# Patient Record
Sex: Female | Born: 1975
Health system: Southern US, Community
[De-identification: ages and names within clinical notes are randomized; demographics above are authoritative.]

## PROBLEM LIST (undated history)

## (undated) HISTORY — PX: DENTAL SURGERY: SHX609

---

## 2001-02-09 ENCOUNTER — Encounter: Payer: Self-pay | Admitting: Emergency Medicine

## 2001-02-09 ENCOUNTER — Emergency Department (HOSPITAL_COMMUNITY): Admission: EM | Admit: 2001-02-09 | Discharge: 2001-02-09 | Payer: Self-pay | Admitting: Emergency Medicine

## 2001-02-13 ENCOUNTER — Encounter: Payer: Self-pay | Admitting: Orthopaedic Surgery

## 2001-02-13 ENCOUNTER — Ambulatory Visit (HOSPITAL_COMMUNITY): Admission: RE | Admit: 2001-02-13 | Discharge: 2001-02-13 | Payer: Self-pay | Admitting: Orthopaedic Surgery

## 2001-02-24 ENCOUNTER — Emergency Department (HOSPITAL_COMMUNITY): Admission: EM | Admit: 2001-02-24 | Discharge: 2001-02-25 | Payer: Self-pay | Admitting: *Deleted

## 2006-09-01 ENCOUNTER — Other Ambulatory Visit: Admission: RE | Admit: 2006-09-01 | Discharge: 2006-09-01 | Payer: Self-pay | Admitting: Obstetrics and Gynecology

## 2006-09-30 ENCOUNTER — Ambulatory Visit (HOSPITAL_COMMUNITY): Admission: RE | Admit: 2006-09-30 | Discharge: 2006-09-30 | Payer: Self-pay | Admitting: Obstetrics & Gynecology

## 2006-10-21 ENCOUNTER — Ambulatory Visit (HOSPITAL_COMMUNITY): Admission: RE | Admit: 2006-10-21 | Discharge: 2006-10-21 | Payer: Self-pay | Admitting: Obstetrics and Gynecology

## 2010-05-30 ENCOUNTER — Ambulatory Visit (HOSPITAL_COMMUNITY)
Admission: RE | Admit: 2010-05-30 | Discharge: 2010-05-30 | Payer: Self-pay | Source: Home / Self Care | Attending: Family Medicine | Admitting: Family Medicine

## 2012-01-02 IMAGING — CT CT ABD-PELV W/ CM
2 of 4 series · 16 of 46 positions shown, 18 images · IV contrast (agent unspecified)
Comparison: None.

CLINICAL DATA: Abdominal pain and fever.  Evaluate for small bowel
obstruction.

CT ABDOMEN AND PELVIS WITH CONTRAST
TECHNIQUE: Multidetector CT imaging of the abdomen and pelvis was
performed following the standard protocol during bolus
administration of intravenous contrast.
Contrast: 100 ml [id number]

[Series 2: rtn a/p with · axial · 0.74mm/px · z∈[-462,-27]mm · 13 of 97 slices shown, 15 images]
[im 5/97  soft-tissue]
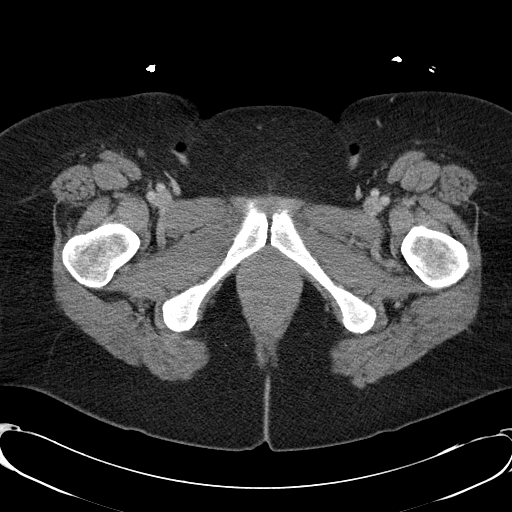
[im 5/97  bone]
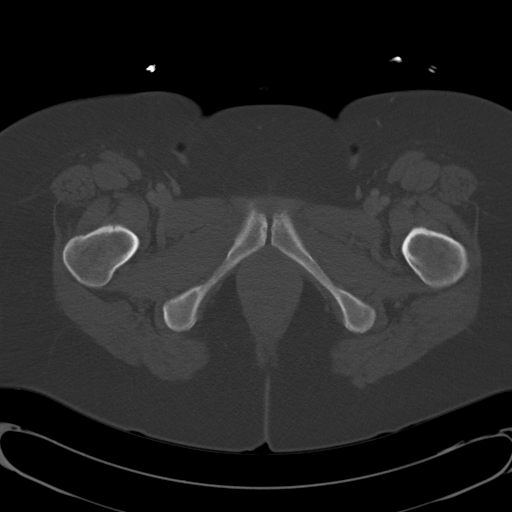
[im 13/97  soft-tissue]
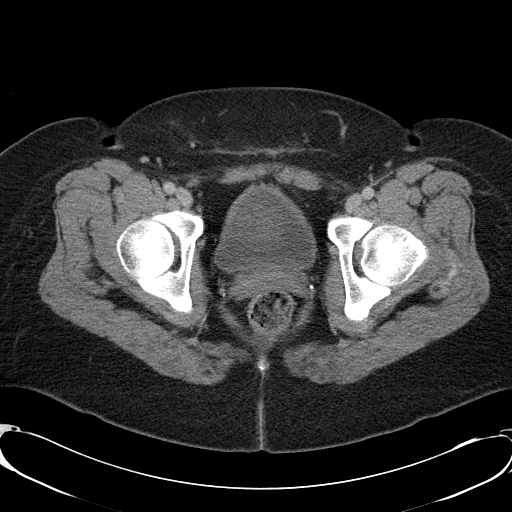
[im 21/97  soft-tissue]
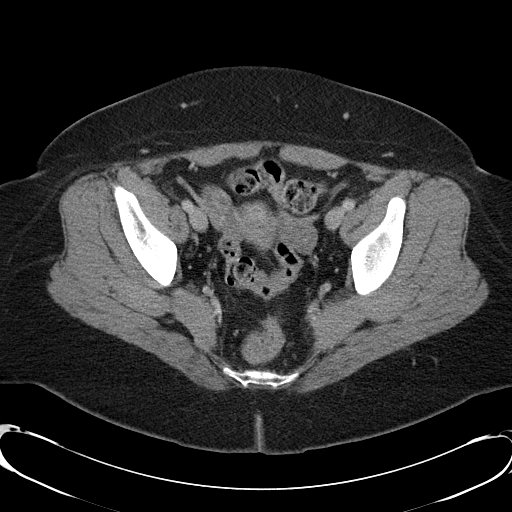
[im 26/97  soft-tissue]
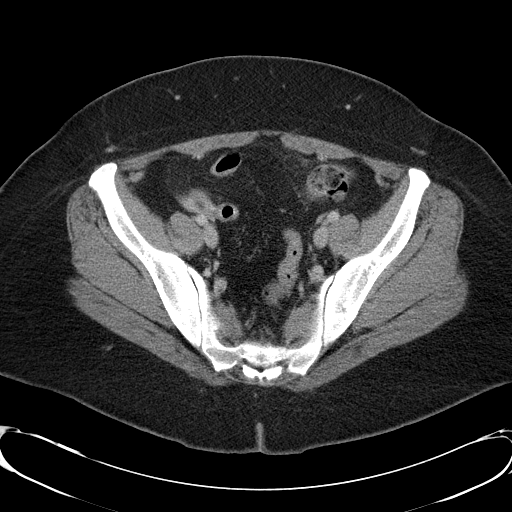
[im 34/97  soft-tissue]
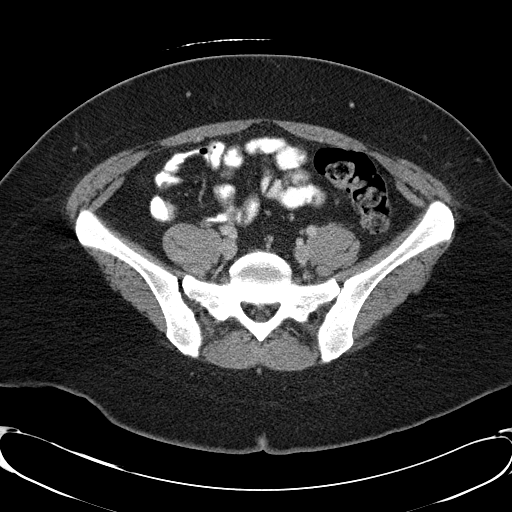
[im 42/97  soft-tissue]
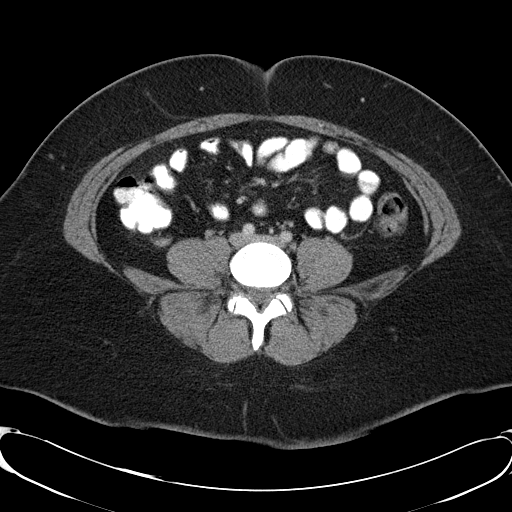
[im 51/97  soft-tissue]
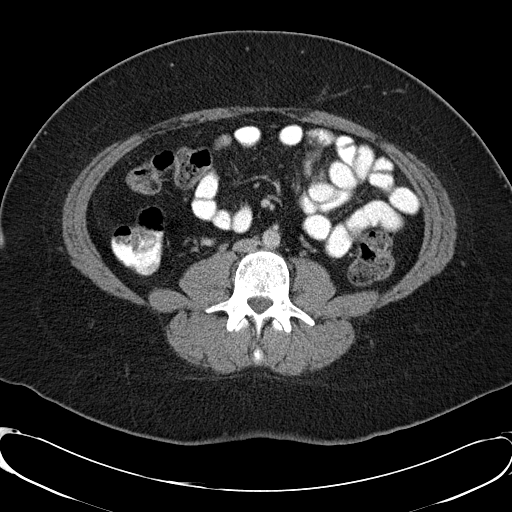
[im 55/97  soft-tissue]
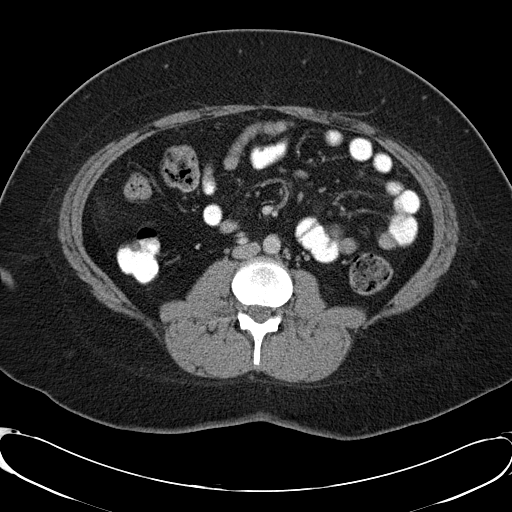
[im 63/97  soft-tissue]
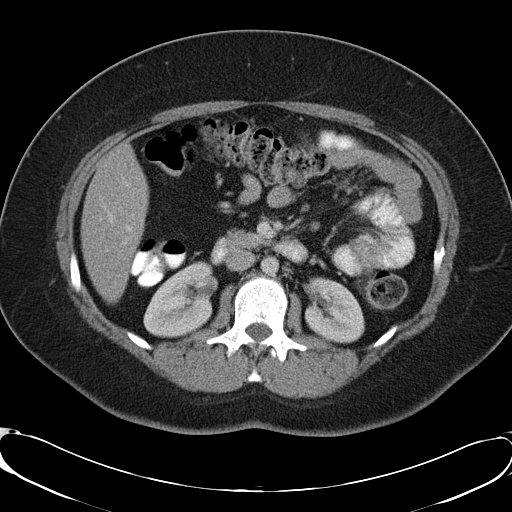
[im 63/97  bone]
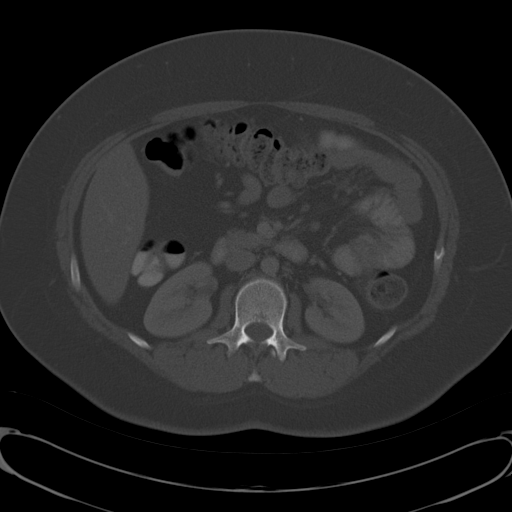
[im 71/97  soft-tissue]
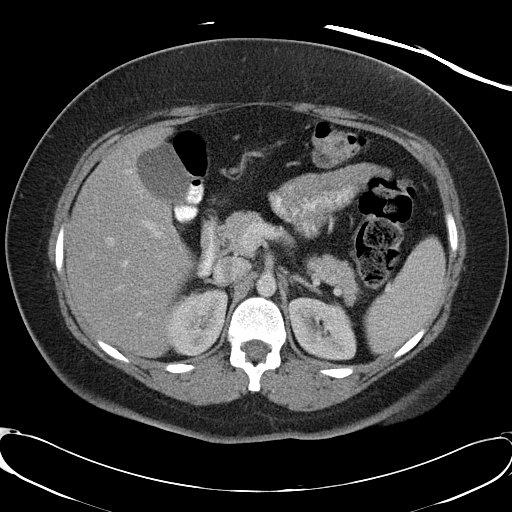
[im 76/97  soft-tissue]
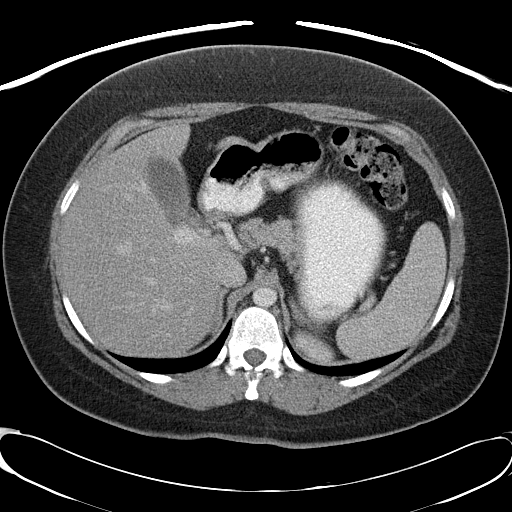
[im 84/97  soft-tissue]
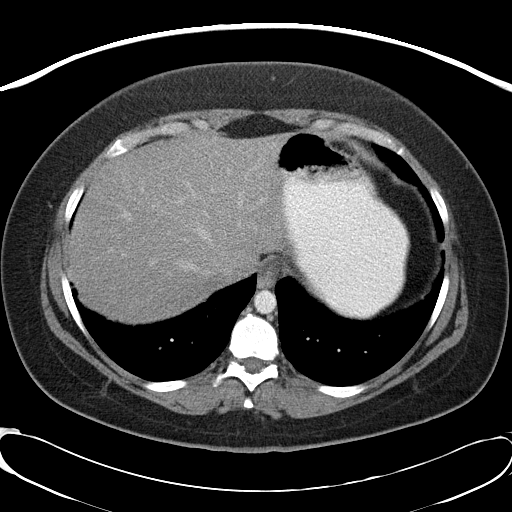
[im 92/97  soft-tissue]
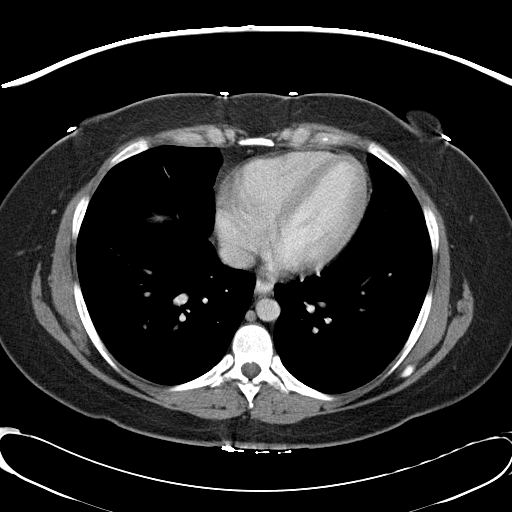

[Series 602: <mpr thick range> · coronal · 0.94mm/px · 3 of 94 slices shown]
[im 32/94  soft-tissue]
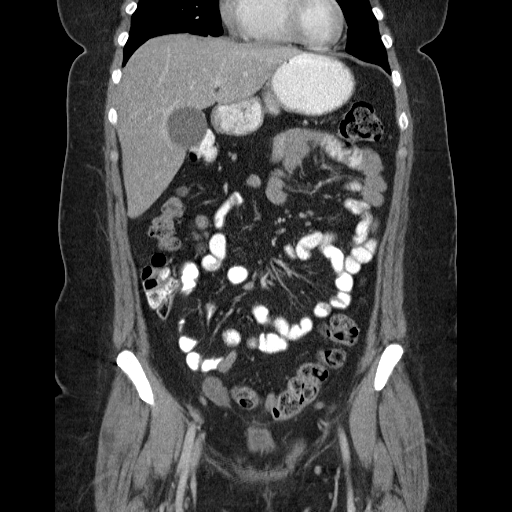
[im 42/94  soft-tissue]
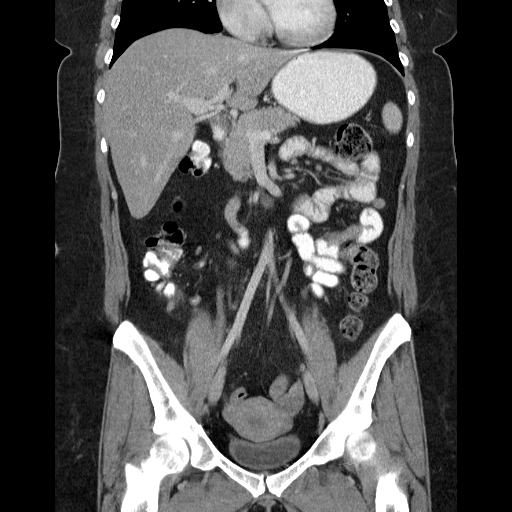
[im 52/94  soft-tissue]
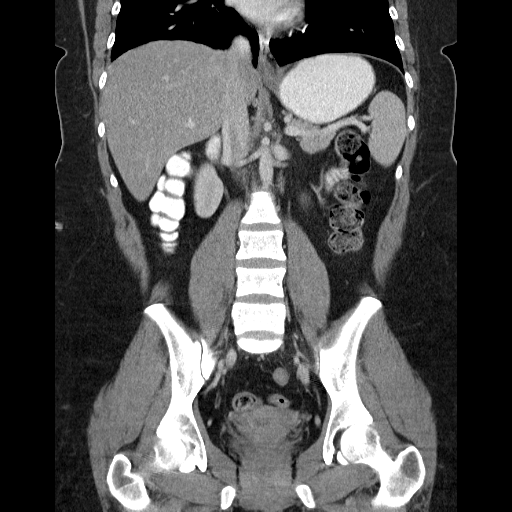

[16 of 46 positions shown; findings below may reference images not displayed]

FINDINGS: Visualized lung bases are clear.  There is diffuse fatty
infiltration of the liver with focal sparing in the gallbladder
fossa.  No focal hepatic lesion or biliary ductal dilatation.  The
gallbladder, common bile duct, pancreas, spleen, adrenal glands,
and kidneys are within normal limits.  Ureters are normal in
caliber.

The stomach, small bowel loops, and terminal ileum are within
normal limits.  There is a moderate amount of stool in the colon.
There is very mild stranding in the pericolonic fat in the anterior
left pelvis adjacent to the sigmoid colon.  No discrete diverticula
seen in this region.  There is no abscess, free air, or bowel wall
thickening.

Negative for lymphadenopathy.

The uterus, adnexa, and appendix are normal in appearance.

Urinary bladder is not very distended at the time imaging.
Apparent mild bladder wall thickening is likely due to
underdistension.  Correlation with urinalysis should be considered.
Inguinal regions and abdominal wall are unremarkable.

The bones appear within normal limits.  No significant degenerative
change, acute, or destructive osseous lesion.
IMPRESSION: 1.  Negative for small bowel obstruction.  No definite finding to
explain the patient's symptoms.
2.  Very mild stranding in the fat focally adjacent to the sigmoid
colon anterior left pelvis.  This is of uncertain significance.  No
diverticula, bowel wall thickening, or fluid collection is seen.
3.  Apparent mild bladder wall thickening may simply be due to
underdistension of the bladder.  Correlation with urinalysis would
be useful, as bladder wall thickening can be seen in the setting of
bladder infection.
4.  Fatty infiltration of the liver.

## 2012-11-03 ENCOUNTER — Other Ambulatory Visit: Payer: Self-pay | Admitting: Physician Assistant

## 2013-04-03 ENCOUNTER — Encounter (HOSPITAL_COMMUNITY): Payer: Self-pay | Admitting: Emergency Medicine

## 2013-04-03 ENCOUNTER — Emergency Department (HOSPITAL_COMMUNITY)
Admission: EM | Admit: 2013-04-03 | Discharge: 2013-04-03 | Disposition: A | Discharge status: Home / Self Care | Payer: Managed Care, Other (non HMO) | Attending: Emergency Medicine | Admitting: Emergency Medicine

## 2013-04-03 ENCOUNTER — Emergency Department (HOSPITAL_COMMUNITY): Payer: Managed Care, Other (non HMO)

## 2013-04-03 DIAGNOSIS — X500XXA Overexertion from strenuous movement or load, initial encounter: Secondary | ICD-10-CM | POA: Insufficient documentation

## 2013-04-03 DIAGNOSIS — R05 Cough: Secondary | ICD-10-CM | POA: Insufficient documentation

## 2013-04-03 DIAGNOSIS — S0993XA Unspecified injury of face, initial encounter: Secondary | ICD-10-CM | POA: Insufficient documentation

## 2013-04-03 DIAGNOSIS — R51 Headache: Secondary | ICD-10-CM | POA: Insufficient documentation

## 2013-04-03 DIAGNOSIS — J3489 Other specified disorders of nose and nasal sinuses: Secondary | ICD-10-CM | POA: Insufficient documentation

## 2013-04-03 DIAGNOSIS — Y92410 Unspecified street and highway as the place of occurrence of the external cause: Secondary | ICD-10-CM | POA: Insufficient documentation

## 2013-04-03 DIAGNOSIS — Z3202 Encounter for pregnancy test, result negative: Secondary | ICD-10-CM | POA: Insufficient documentation

## 2013-04-03 DIAGNOSIS — R5381 Other malaise: Secondary | ICD-10-CM | POA: Insufficient documentation

## 2013-04-03 DIAGNOSIS — R0981 Nasal congestion: Secondary | ICD-10-CM

## 2013-04-03 DIAGNOSIS — Y9389 Activity, other specified: Secondary | ICD-10-CM | POA: Insufficient documentation

## 2013-04-03 DIAGNOSIS — H8110 Benign paroxysmal vertigo, unspecified ear: Secondary | ICD-10-CM

## 2013-04-03 DIAGNOSIS — R059 Cough, unspecified: Secondary | ICD-10-CM | POA: Insufficient documentation

## 2013-04-03 LAB — CBC WITH DIFFERENTIAL/PLATELET
Basophils Relative: 1 % (ref 0–1)
Eosinophils Absolute: 0 10*3/uL (ref 0.0–0.7)
Eosinophils Relative: 0 % (ref 0–5)
HCT: 41.2 % (ref 36.0–46.0)
Lymphocytes Relative: 11 % — ABNORMAL LOW (ref 12–46)
Lymphs Abs: 1.7 10*3/uL (ref 0.7–4.0)
MCH: 27.5 pg (ref 26.0–34.0)
MCV: 84.4 fL (ref 78.0–100.0)
Monocytes Absolute: 0.9 10*3/uL (ref 0.1–1.0)
Monocytes Relative: 6 % (ref 3–12)
Platelets: 410 10*3/uL — ABNORMAL HIGH (ref 150–400)
RDW: 13.4 % (ref 11.5–15.5)
WBC: 15.6 10*3/uL — ABNORMAL HIGH (ref 4.0–10.5)

## 2013-04-03 LAB — POCT I-STAT, CHEM 8
BUN: 12 mg/dL (ref 6–23)
Calcium, Ion: 1.2 mmol/L (ref 1.12–1.23)
Chloride: 102 mEq/L (ref 96–112)
Creatinine, Ser: 1 mg/dL (ref 0.50–1.10)
Glucose, Bld: 119 mg/dL — ABNORMAL HIGH (ref 70–99)
HCT: 44 % (ref 36.0–46.0)

## 2013-04-03 LAB — POCT PREGNANCY, URINE: Preg Test, Ur: NEGATIVE

## 2013-04-03 MED ORDER — MECLIZINE HCL 12.5 MG PO TABS: 12.5000 mg | ORAL_TABLET | Freq: Three times a day (TID) | ORAL | Status: DC | PRN

## 2013-04-03 MED ORDER — PSEUDOEPH-BROMPHEN-DM 30-2-10 MG/5ML PO SYRP: 5.0000 mL | ORAL_SOLUTION | Freq: Four times a day (QID) | ORAL | Status: DC | PRN

## 2013-04-03 MED ORDER — PSEUDOEPHEDRINE HCL ER 120 MG PO TB12
120.0000 mg | ORAL_TABLET | Freq: Two times a day (BID) | ORAL | Status: DC
  Administered 2013-04-03: 120 mg via ORAL
  Filled 2013-04-03: qty 1

## 2013-04-03 NOTE — ED Notes (Addendum)
Patient states she felt something "pop in her head" and felt like she was going to pass out. Patient states she had someone else drive her to the ED due to weakness and near syncope. Patient denies having a headache, but states her head feels "really heavy."

## 2013-04-03 NOTE — ED Provider Notes (Signed)
CSN: [id number]     Arrival date & time 04/03/13  1448 History   First MD Initiated Contact with Patient 04/03/13 1650     Chief Complaint  Patient presents with  . Near Syncope  . Weakness   (Consider location/radiation/quality/duration/timing/severity/associated sxs/prior Treatment) HPI Comments: Patient here with near syncope and generalized weakness - she states that while driving today she turned her head and immediately felt a pop in her head.  She states that after this she felt like she might pass out but did not and also felt dizziness.  Since then she reports facial pain and a fullness sensation in her ears, face and head.  She denies pain currently.  She reports symptoms are worse with standing and movement of her head.  She reports recent URI last week but nothing since then.  Patient is a 37 y.o. female presenting with weakness. The history is provided by the patient. No language interpreter was used.  Weakness This is a new problem. The current episode started today. The problem occurs constantly. The problem has been unchanged. Associated symptoms include coughing, headaches, vertigo and weakness. Pertinent negatives include no abdominal pain, arthralgias, chest pain, congestion, diaphoresis, fatigue, fever, myalgias, nausea, numbness, sore throat, swollen glands, visual change or vomiting. The symptoms are aggravated by twisting. She has tried nothing for the symptoms. The treatment provided no relief.    History reviewed. No pertinent past medical history. Past Surgical History  Procedure Laterality Date  . Cesarean section    . Dental surgery     Family History  Problem Relation Age of Onset  . Asthma Mother   . COPD Mother   . Cancer Father   . Diabetes Other    History  Substance Use Topics  . Smoking status: Never Smoker   . Smokeless tobacco: Never Used  . Alcohol Use: No   OB History   Grav Para Term Preterm Abortions TAB SAB Ect Mult Living                  Review of Systems  Constitutional: Negative for fever, diaphoresis and fatigue.  HENT: Negative for congestion and sore throat.   Respiratory: Positive for cough.   Cardiovascular: Negative for chest pain.  Gastrointestinal: Negative for nausea, vomiting and abdominal pain.  Musculoskeletal: Negative for arthralgias and myalgias.  Neurological: Positive for vertigo, weakness and headaches. Negative for numbness.  All other systems reviewed and are negative.    Allergies  Darvocet  Home Medications   Current Outpatient Rx  Name  Route  Sig  Dispense  Refill  . DEXILANT 60 MG capsule      TAKE ONE CAPSULE BY MOUTH EVERY DAY   30 capsule   0     Must be seen before next refill    BP 124/77  Pulse 92  Temp(Src) 98.6 F (37 C) (Oral)  Resp 16  Ht 5\' 7"  (1.702 m)  Wt 230 lb (104.327 kg)  BMI 36.01 kg/m2  SpO2 99%  LMP 03/20/2013 Physical Exam  Nursing note and vitals reviewed. Constitutional: She is oriented to person, place, and time. She appears well-developed and well-nourished. No distress.  HENT:  Head: Normocephalic and atraumatic.  Right Ear: External ear normal.  Left Ear: External ear normal.  Nose: Nose normal.  Mouth/Throat: Oropharynx is clear and moist. No oropharyngeal exudate.  Eyes: Conjunctivae are normal. Pupils are equal, round, and reactive to light. No scleral icterus.  No nystagmus  Neck: Normal range of motion.  Neck supple.  Cardiovascular: Normal rate, regular rhythm and normal heart sounds.  Exam reveals no gallop and no friction rub.   No murmur heard. Pulmonary/Chest: Effort normal and breath sounds normal. No respiratory distress. She has no wheezes. She has no rales. She exhibits no tenderness.  Abdominal: Soft. Bowel sounds are normal. She exhibits no distension.  Musculoskeletal: Normal range of motion. She exhibits no edema and no tenderness.  Lymphadenopathy:    She has no cervical adenopathy.  Neurological: She is alert and  oriented to person, place, and time. She exhibits normal muscle tone. Coordination normal.  Skin: Skin is warm and dry. No rash noted. No erythema. No pallor.  Psychiatric: She has a normal mood and affect. Her behavior is normal. Judgment and thought content normal.    ED Course  Procedures (including critical care time) Labs Review Labs Reviewed  CBC WITH DIFFERENTIAL - Abnormal; Notable for the following:    WBC 15.6 (*)    Platelets 410 (*)    Neutrophils Relative % 83 (*)    Neutro Abs 12.9 (*)    Lymphocytes Relative 11 (*)    All other components within normal limits  POCT I-STAT, CHEM 8 - Abnormal; Notable for the following:    Glucose, Bld 119 (*)    All other components within normal limits  POCT PREGNANCY, URINE   Imaging Review Ct Head Wo Contrast  04/03/2013   CLINICAL DATA:  Headache and near syncope today  EXAM: CT HEAD WITHOUT CONTRAST  TECHNIQUE: Contiguous axial images were obtained from the base of the skull through the vertex without intravenous contrast.  COMPARISON:  None.  FINDINGS: No mass lesion. No midline shift. No acute hemorrhage or hematoma. No extra-axial fluid collections. No evidence of acute infarction. No significant inflammatory change in the visualized sinuses. Calvarium intact.  IMPRESSION: Negative   Electronically Signed   By: Esperanza Heir M.D.   On: 04/03/2013 18:09    EKG Interpretation   None      Results for orders placed during the hospital encounter of 04/03/13  CBC WITH DIFFERENTIAL      Result Value Range   WBC 15.6 (*) 4.0 - 10.5 K/uL   RBC 4.88  3.87 - 5.11 MIL/uL   Hemoglobin 13.4  12.0 - 15.0 g/dL   HCT 16.1  09.6 - 04.5 %   MCV 84.4  78.0 - 100.0 fL   MCH 27.5  26.0 - 34.0 pg   MCHC 32.5  30.0 - 36.0 g/dL   RDW 40.9  81.1 - 91.4 %   Platelets 410 (*) 150 - 400 K/uL   Neutrophils Relative % 83 (*) 43 - 77 %   Neutro Abs 12.9 (*) 1.7 - 7.7 K/uL   Lymphocytes Relative 11 (*) 12 - 46 %   Lymphs Abs 1.7  0.7 - 4.0 K/uL    Monocytes Relative 6  3 - 12 %   Monocytes Absolute 0.9  0.1 - 1.0 K/uL   Eosinophils Relative 0  0 - 5 %   Eosinophils Absolute 0.0  0.0 - 0.7 K/uL   Basophils Relative 1  0 - 1 %   Basophils Absolute 0.1  0.0 - 0.1 K/uL  POCT PREGNANCY, URINE      Result Value Range   Preg Test, Ur NEGATIVE  NEGATIVE  POCT I-STAT, CHEM 8      Result Value Range   Sodium 141  135 - 145 mEq/L   Potassium 3.7  3.5 -  5.1 mEq/L   Chloride 102  96 - 112 mEq/L   BUN 12  6 - 23 mg/dL   Creatinine, Ser 4.09  0.50 - 1.10 mg/dL   Glucose, Bld 811 (*) 70 - 99 mg/dL   Calcium, Ion 9.14  1.12 - 1.23 mmol/L   TCO2 26  0 - 100 mmol/L   Hemoglobin 15.0  12.0 - 15.0 g/dL   HCT 78.2  95.6 - 21.3 %   Ct Head Wo Contrast  04/03/2013   CLINICAL DATA:  Headache and near syncope today  EXAM: CT HEAD WITHOUT CONTRAST  TECHNIQUE: Contiguous axial images were obtained from the base of the skull through the vertex without intravenous contrast.  COMPARISON:  None.  FINDINGS: No mass lesion. No midline shift. No acute hemorrhage or hematoma. No extra-axial fluid collections. No evidence of acute infarction. No significant inflammatory change in the visualized sinuses. Calvarium intact.  IMPRESSION: Negative   Electronically Signed   By: Esperanza Heir M.D.   On: 04/03/2013 18:09      MDM  Sinus congestion BPV  Patient here with symptoms c/w BPV, though she felt a pop in her head, her neuro examination is normal with normal gait so I doubt a central etiology to this.  She also has mild pressure and fullness to her face and head, this could be sinus congestion - will place on nasal decongestant and will start on antivert.   Izola Price Marisue Humble, [place] 04/03/13 0865

## 2013-04-03 NOTE — ED Notes (Signed)
Patient transported to CT 

## 2013-04-03 NOTE — ED Notes (Signed)
PA at bedside.

## 2013-04-04 NOTE — ED Provider Notes (Signed)
Medical screening examination/treatment/procedure(s) were performed by non-physician practitioner and as supervising physician I was immediately available for consultation/collaboration.  Trannie Bardales F Teofila Bowery, MD 04/04/13 0851 

## 2013-06-26 ENCOUNTER — Encounter (INDEPENDENT_AMBULATORY_CARE_PROVIDER_SITE_OTHER): Payer: Self-pay

## 2013-06-26 ENCOUNTER — Encounter: Payer: Self-pay | Admitting: General Practice

## 2013-06-26 ENCOUNTER — Ambulatory Visit (INDEPENDENT_AMBULATORY_CARE_PROVIDER_SITE_OTHER): Payer: Medicare HMO | Admitting: General Practice

## 2013-06-26 VITALS — BP 127/76 | HR 83 | Temp 99.0°F | Ht 67.0 in | Wt 254.0 lb

## 2013-06-26 DIAGNOSIS — R059 Cough, unspecified: Secondary | ICD-10-CM

## 2013-06-26 DIAGNOSIS — K219 Gastro-esophageal reflux disease without esophagitis: Secondary | ICD-10-CM

## 2013-06-26 DIAGNOSIS — J069 Acute upper respiratory infection, unspecified: Secondary | ICD-10-CM

## 2013-06-26 DIAGNOSIS — R05 Cough: Secondary | ICD-10-CM

## 2013-06-26 DIAGNOSIS — J209 Acute bronchitis, unspecified: Secondary | ICD-10-CM

## 2013-06-26 MED ORDER — BENZONATATE 100 MG PO CAPS: 100.0000 mg | ORAL_CAPSULE | Freq: Three times a day (TID) | ORAL | Status: DC | PRN

## 2013-06-26 MED ORDER — AZITHROMYCIN 250 MG PO TABS: ORAL_TABLET | ORAL | Status: DC

## 2013-06-26 MED ORDER — DEXLANSOPRAZOLE 60 MG PO CPDR: 60.0000 mg | DELAYED_RELEASE_CAPSULE | Freq: Every day | ORAL | Status: DC

## 2013-06-26 MED ORDER — METHYLPREDNISOLONE ACETATE 80 MG/ML IJ SUSP
80.0000 mg | Freq: Once | INTRAMUSCULAR | Status: AC
  Administered 2013-06-26: 80 mg via INTRAMUSCULAR

## 2013-06-26 MED ORDER — PREDNISONE (PAK) 10 MG PO TABS: ORAL_TABLET | ORAL | Status: DC

## 2013-06-26 NOTE — Patient Instructions (Signed)

## 2013-06-26 NOTE — Progress Notes (Signed)
   Subjective:    Patient ID: Cathy Stone, female    DOB: 12/16/1975, 38 y.o.   MRN: [id number]  Cough This is a new problem. The current episode started in the past 7 days. The problem has been gradually worsening. The problem occurs every few minutes. The cough is non-productive. Associated symptoms include postnasal drip. Pertinent negatives include no chest pain, chills, ear congestion, fever, headaches, nasal congestion, rhinorrhea, sore throat, shortness of breath or wheezing. Nothing aggravates the symptoms. She has tried nothing for the symptoms. There is no history of asthma, bronchitis or pneumonia.  Patient reports noticing periodic symptoms (congestion, cough, sneezing) in the past year, along the same time starting a new job.  Reports a history of GERD and dexilant is very effective.    Review of Systems  Constitutional: Negative for fever and chills.  HENT: Positive for postnasal drip. Negative for dental problem, rhinorrhea, sinus pressure and sore throat.   Respiratory: Positive for cough. Negative for chest tightness, shortness of breath and wheezing.   Cardiovascular: Negative for chest pain and palpitations.  Neurological: Negative for dizziness, weakness and headaches.       Objective:   Physical Exam  Constitutional: She is oriented to person, place, and time. She appears well-developed and well-nourished.  HENT:  Head: Normocephalic and atraumatic.  Right Ear: External ear normal.  Left Ear: External ear normal.  Cardiovascular: Normal rate, regular rhythm and normal heart sounds.   Pulmonary/Chest: Effort normal and breath sounds normal. No respiratory distress. She exhibits no tenderness.  Bronchial cough  Neurological: She is alert and oriented to person, place, and time.  Skin: Skin is warm and dry.  Psychiatric: She has a normal mood and affect.          Assessment & Plan:  1. GERD (gastroesophageal reflux disease)  - dexlansoprazole (DEXILANT)  60 MG capsule; Take 1 capsule (60 mg total) by mouth daily.  Dispense: 30 capsule; Refill: 3  2. Acute bronchitis  - methylPREDNISolone acetate (DEPO-MEDROL) injection 80 mg; Inject 1 mL (80 mg total) into the muscle once. - predniSONE (STERAPRED UNI-PAK) 10 MG tablet; Start 06/27/13  Dispense: 21 tablet; Refill: 0  3. Upper respiratory infection  - azithromycin (ZITHROMAX) 250 MG tablet; Take as directed  Dispense: 6 tablet; Refill: 0  4. Cough  - benzonatate (TESSALON) 100 MG capsule; Take 1 capsule (100 mg total) by mouth 3 (three) times daily as needed for cough.  Dispense: 30 capsule; Refill: 0 -will refer to ENT if symptoms persist or no improvement -RTO if symptoms worsen -increase fluids -avoid irritants -Patient verbalized understanding Coralie KeensMae E. Atianna Haidar, FNP-C

## 2013-07-10 ENCOUNTER — Telehealth: Payer: Self-pay | Admitting: General Practice

## 2013-07-10 NOTE — Telephone Encounter (Signed)
Pt states cough no better, ears stopped up, runny nose, dizziness.  Do you want to see her again or refer pt. See last office visit note.

## 2013-07-10 NOTE — Telephone Encounter (Signed)
Please make appointment.

## 2013-07-11 ENCOUNTER — Ambulatory Visit (INDEPENDENT_AMBULATORY_CARE_PROVIDER_SITE_OTHER): Payer: Medicare HMO | Admitting: General Practice

## 2013-07-11 ENCOUNTER — Encounter: Payer: Self-pay | Admitting: General Practice

## 2013-07-11 VITALS — BP 125/74 | HR 77 | Temp 98.1°F | Ht 67.0 in | Wt 252.5 lb

## 2013-07-11 DIAGNOSIS — J309 Allergic rhinitis, unspecified: Secondary | ICD-10-CM

## 2013-07-11 DIAGNOSIS — R05 Cough: Secondary | ICD-10-CM

## 2013-07-11 DIAGNOSIS — Z20828 Contact with and (suspected) exposure to other viral communicable diseases: Secondary | ICD-10-CM

## 2013-07-11 DIAGNOSIS — R059 Cough, unspecified: Secondary | ICD-10-CM

## 2013-07-11 DIAGNOSIS — R42 Dizziness and giddiness: Secondary | ICD-10-CM

## 2013-07-11 LAB — POCT INFLUENZA A/B
INFLUENZA B, POC: NEGATIVE
Influenza A, POC: NEGATIVE

## 2013-07-11 MED ORDER — MECLIZINE HCL 12.5 MG PO TABS: 12.5000 mg | ORAL_TABLET | Freq: Three times a day (TID) | ORAL | Status: DC | PRN

## 2013-07-11 MED ORDER — CETIRIZINE HCL 10 MG PO TABS: 10.0000 mg | ORAL_TABLET | Freq: Every day | ORAL | Status: DC

## 2013-07-11 MED ORDER — MECLIZINE HCL 12.5 MG PO TABS: 12.5000 mg | ORAL_TABLET | Freq: Two times a day (BID) | ORAL | Status: DC | PRN

## 2013-07-11 NOTE — Patient Instructions (Signed)
Allergic Rhinitis Allergic rhinitis is when the mucous membranes in the nose respond to allergens. Allergens are particles in the air that cause your body to have an allergic reaction. This causes you to release allergic antibodies. Through a chain of events, these eventually cause you to release histamine into the blood stream. Although meant to protect the body, it is this release of histamine that causes your discomfort, such as frequent sneezing, congestion, and an itchy, runny nose.  CAUSES  Seasonal allergic rhinitis (hay fever) is caused by pollen allergens that may come from grasses, trees, and weeds. Year-round allergic rhinitis (perennial allergic rhinitis) is caused by allergens such as house dust mites, pet dander, and mold spores.  SYMPTOMS   Nasal stuffiness (congestion).  Itchy, runny nose with sneezing and tearing of the eyes. DIAGNOSIS  Your health care provider can help you determine the allergen or allergens that trigger your symptoms. If you and your health care provider are unable to determine the allergen, skin or blood testing may be used. TREATMENT  Allergic Rhinitis does not have a cure, but it can be controlled by:  Medicines and allergy shots (immunotherapy).  Avoiding the allergen. Hay fever may often be treated with antihistamines in pill or nasal spray forms. Antihistamines block the effects of histamine. There are over-the-counter medicines that may help with nasal congestion and swelling around the eyes. Check with your health care provider before taking or giving this medicine.  If avoiding the allergen or the medicine prescribed do not work, there are many new medicines your health care provider can prescribe. Stronger medicine may be used if initial measures are ineffective. Desensitizing injections can be used if medicine and avoidance does not work. Desensitization is when a patient is given ongoing shots until the body becomes less sensitive to the allergen.  Make sure you follow up with your health care provider if problems continue. HOME CARE INSTRUCTIONS It is not possible to completely avoid allergens, but you can reduce your symptoms by taking steps to limit your exposure to them. It helps to know exactly what you are allergic to so that you can avoid your specific triggers. SEEK MEDICAL CARE IF:   You have a fever.  You develop a cough that does not stop easily (persistent).  You have shortness of breath.  You start wheezing.  Symptoms interfere with normal daily activities. Document Released: 02/24/2001 Document Revised: 03/22/2013 Document Reviewed: 02/06/2013 [employer] Patient Information 2014 [employer].  

## 2013-07-11 NOTE — Progress Notes (Signed)
   Subjective:    Patient ID: Cathy Stone, female    DOB: 06/02/1976, 38 y.o.   MRN: [id number]  Cough This is a new problem. The current episode started in the past 7 days. The problem has been gradually worsening. The problem occurs every few minutes. The cough is non-productive. Associated symptoms include chills, myalgias, nasal congestion, postnasal drip and rhinorrhea. Pertinent negatives include no chest pain, fever, headaches, sore throat, weight loss or wheezing. Exacerbated by: work [employer], [employer]. She has tried oral steroids for the symptoms. Her past medical history is significant for bronchitis. There is no history of asthma, COPD or pneumonia.  Reports cough had resolved, then returned upon going back to work.     Review of Systems  Constitutional: Positive for chills. Negative for fever and weight loss.  HENT: Positive for congestion, postnasal drip, rhinorrhea and sinus pressure. Negative for sore throat.   Respiratory: Positive for cough. Negative for chest tightness and wheezing.   Cardiovascular: Negative for chest pain and palpitations.  Musculoskeletal: Positive for myalgias.  Neurological: Negative for dizziness, weakness and headaches.       Objective:   Physical Exam  Constitutional: She is oriented to person, place, and time. She appears well-developed and well-nourished.  HENT:  Head: Normocephalic and atraumatic.  Right Ear: External ear normal.  Left Ear: External ear normal.  Cardiovascular: Normal rate, regular rhythm and normal heart sounds.   Pulmonary/Chest: Effort normal and breath sounds normal. No respiratory distress. She exhibits no tenderness.  Neurological: She is alert and oriented to person, place, and time.  Skin: Skin is warm and dry.  Psychiatric: She has a normal mood and affect.     Results for orders placed in visit on 07/11/13  POCT INFLUENZA A/B      Result Value Range   Influenza A, POC Negative     Influenza B, POC Negative           Assessment & Plan:  1. Cough   2. Exposure to the flu  - POCT Influenza A/B  3. Allergic rhinitis  - cetirizine (ZYRTEC) 10 MG tablet; Take 1 tablet (10 mg total) by mouth daily.  Dispense: 30 tablet; Refill: 6 - Ambulatory referral to Allergy  4. Vertigo - meclizine (ANTIVERT) 12.5 MG tablet; Take 1 tablet (12.5 mg total) by mouth 2 (two) times daily as needed.  Dispense: 30 tablet; Refill: 0 -sedation precaution discussed -RTO if symptoms worsen or unresolved Patient verbalized understanding Coralie KeensMae E. Javaria Knapke, FNP-C

## 2013-07-11 NOTE — Telephone Encounter (Signed)
appt made

## 2013-08-14 ENCOUNTER — Institutional Professional Consult (permissible substitution): Payer: Managed Care, Other (non HMO) | Admitting: Internal Medicine

## 2013-11-24 ENCOUNTER — Telehealth: Payer: Self-pay | Admitting: Family Medicine

## 2013-11-24 ENCOUNTER — Ambulatory Visit (INDEPENDENT_AMBULATORY_CARE_PROVIDER_SITE_OTHER): Payer: Medicare HMO

## 2013-11-24 ENCOUNTER — Encounter: Payer: Self-pay | Admitting: Family Medicine

## 2013-11-24 ENCOUNTER — Ambulatory Visit (INDEPENDENT_AMBULATORY_CARE_PROVIDER_SITE_OTHER): Payer: Medicare HMO | Admitting: Family Medicine

## 2013-11-24 VITALS — BP 116/76 | HR 76 | Temp 99.1°F | Ht 67.0 in | Wt 251.0 lb

## 2013-11-24 DIAGNOSIS — R059 Cough, unspecified: Secondary | ICD-10-CM

## 2013-11-24 DIAGNOSIS — R05 Cough: Secondary | ICD-10-CM

## 2013-11-24 DIAGNOSIS — R609 Edema, unspecified: Secondary | ICD-10-CM

## 2013-11-24 DIAGNOSIS — R5381 Other malaise: Secondary | ICD-10-CM

## 2013-11-24 DIAGNOSIS — M549 Dorsalgia, unspecified: Secondary | ICD-10-CM

## 2013-11-24 DIAGNOSIS — R5383 Other fatigue: Secondary | ICD-10-CM

## 2013-11-24 DIAGNOSIS — M25539 Pain in unspecified wrist: Secondary | ICD-10-CM

## 2013-11-24 LAB — POCT URINALYSIS DIPSTICK
Bilirubin, UA: NEGATIVE
Glucose, UA: NEGATIVE
Ketones, UA: NEGATIVE
Nitrite, UA: NEGATIVE
Spec Grav, UA: 1.01
Urobilinogen, UA: NEGATIVE
pH, UA: 7.5

## 2013-11-24 LAB — POCT UA - MICROSCOPIC ONLY
Casts, Ur, LPF, POC: NEGATIVE
Crystals, Ur, HPF, POC: NEGATIVE
Mucus, UA: NEGATIVE
RBC, urine, microscopic: NEGATIVE

## 2013-11-24 MED ORDER — NAPROXEN 500 MG PO TABS: 500.0000 mg | ORAL_TABLET | Freq: Two times a day (BID) | ORAL | Status: DC

## 2013-11-24 NOTE — Addendum Note (Signed)
Addended by: Lisbeth PlyMOORE, Kavita Bartl C on: 11/24/2013 11:18 AM   Modules accepted: Orders

## 2013-11-24 NOTE — Telephone Encounter (Signed)
Put in computer per front

## 2013-11-24 NOTE — Progress Notes (Signed)
   Subjective:    Patient ID: Cathy Stone, female    DOB: 1976-04-12, 38 y.o.   MRN: [id number]  HPI C/o lower extremity edema for a week. She was seen by urgent care last Wednesday for pain in her chest, arms, legs, and back.  She was tested for lymes and RMSF.  She was tx'd with doxycycline, Flexeril, and tylenol #3.  She is having some lower back pain and she elevated her legs last night and now they are down.  She is having weakness and fatigue.  She has been on a new diet for 3 weeks. She is drinking 100 ounces of fluid a day. She has some pains in her chest last week and they have resolved.  She was having joint pain in her wrists and arms. She is having some discomfort in her wrists.   Review of Systems C/o edema, back pain, wrist discomfort, weakness   No chest pain, SOB, HA, dizziness, vision change, N/V, diarrhea, constipation, dysuria, urinary urgency or frequency, myalgias, arthralgias or rash.  Objective:   Physical Exam   Vital signs noted  Well developed well nourished female.  HEENT - Head atraumatic Normocephalic                Eyes - PERRLA, Conjuctiva - clear Sclera- Clear EOMI                Ears - EAC's Wnl TM's Wnl Gross Hearing WNL                Nose - Nares patent                 Throat - oropharanx wnl Respiratory - Lungs CTA bilateral Cardiac - RRR S1 and S2 without murmur GI - Abdomen soft Nontender and bowel sounds active x 4 Extremities - No edema. Neuro - Grossly intact. MS - TTP lumbar spine     CXR - No infiltrates Prelimnary reading by Iverson Alamin Assessment & Plan:  Back pain - Plan: POCT urinalysis dipstick, POCT UA - Microscopic Only, CMP14+EGFR, Arthritis Panel, Thyroid Panel With TSH, naproxen (NAPROSYN) 500 MG tablet, CANCELED: POCT CBC Continue pain meds and flexeril  Edema - Plan: POCT urinalysis dipstick, POCT UA - Microscopic Only, CMP14+EGFR, Arthritis Panel, Thyroid Panel With TSH, CANCELED: POCT CBC.  Reassured and  instructed to decrease her Water intake  Other malaise and fatigue - Plan: POCT urinalysis dipstick, POCT UA - Microscopic Only, CMP14+EGFR, Arthritis Panel, Thyroid Panel With TSH, DG Chest 2 View, CANCELED: POCT CBC  Wrist pain - Plan: POCT urinalysis dipstick, POCT UA - Microscopic Only, CMP14+EGFR, Arthritis Panel, Thyroid Panel With TSH, naproxen (NAPROSYN) 500 MG tablet, CANCELED: POCT CBC  Cough - Plan: DG Chest 2 View - Take otc meds and continue doxycycline  Follow up in one week   Lysbeth Penner FNP

## 2013-11-25 LAB — CMP14+EGFR
ALT: 29 IU/L (ref 0–32)
AST: 19 IU/L (ref 0–40)
Albumin/Globulin Ratio: 1.3 (ref 1.1–2.5)
Albumin: 4.2 g/dL (ref 3.5–5.5)
Alkaline Phosphatase: 97 IU/L (ref 39–117)
BUN/Creatinine Ratio: 13 (ref 8–20)
BUN: 11 mg/dL (ref 6–20)
CO2: 20 mmol/L (ref 18–29)
Calcium: 9.4 mg/dL (ref 8.7–10.2)
Chloride: 102 mmol/L (ref 97–108)
Creatinine, Ser: 0.83 mg/dL (ref 0.57–1.00)
GFR calc Af Amer: 106 mL/min/{1.73_m2} (ref >59)
GFR calc non Af Amer: 92 mL/min/{1.73_m2} (ref >59)
Globulin, Total: 3.2 g/dL (ref 1.5–4.5)
Glucose: 129 mg/dL — ABNORMAL HIGH (ref 65–99)
Potassium: 4.2 mmol/L (ref 3.5–5.2)
Sodium: 140 mmol/L (ref 134–144)
Total Bilirubin: 0.3 mg/dL (ref 0.0–1.2)
Total Protein: 7.4 g/dL (ref 6.0–8.5)

## 2013-11-25 LAB — ARTHRITIS PANEL
Anti Nuclear Antibody(ANA): NEGATIVE
Rheumatoid fact SerPl-aCnc: 8 [IU]/mL (ref 0.0–13.9)
Sed Rate: 9 mm/h (ref 0–32)
Uric Acid: 5.5 mg/dL (ref 2.5–7.1)

## 2013-11-25 LAB — THYROID PANEL WITH TSH
Free Thyroxine Index: 2.3 (ref 1.2–4.9)
T3 Uptake Ratio: 25 % (ref 24–39)
T4, Total: 9 ug/dL (ref 4.5–12.0)
TSH: 2.2 u[IU]/mL (ref 0.450–4.500)

## 2013-11-26 LAB — URINE CULTURE

## 2013-11-27 ENCOUNTER — Telehealth: Payer: Self-pay | Admitting: Family Medicine

## 2013-11-27 NOTE — Telephone Encounter (Signed)
Please review labs for bill? Patient is calling wanting the results

## 2013-11-27 NOTE — Telephone Encounter (Signed)
Pt notified all results were normal Verbalizes understanding

## 2013-12-04 ENCOUNTER — Ambulatory Visit: Payer: Self-pay | Admitting: Family Medicine

## 2014-08-02 ENCOUNTER — Telehealth: Payer: Self-pay | Admitting: Family Medicine

## 2014-08-02 NOTE — Telephone Encounter (Signed)
Appointment given for tomorrow with Cathy Floormary martin, fnp.

## 2014-08-03 ENCOUNTER — Encounter (INDEPENDENT_AMBULATORY_CARE_PROVIDER_SITE_OTHER): Payer: Self-pay

## 2014-08-03 ENCOUNTER — Ambulatory Visit (INDEPENDENT_AMBULATORY_CARE_PROVIDER_SITE_OTHER): Payer: Medicare HMO | Admitting: Nurse Practitioner

## 2014-08-03 ENCOUNTER — Encounter: Payer: Self-pay | Admitting: Nurse Practitioner

## 2014-08-03 VITALS — BP 126/74 | HR 76 | Temp 98.0°F | Ht 67.0 in | Wt 254.0 lb

## 2014-08-03 DIAGNOSIS — K2970 Gastritis, unspecified, without bleeding: Principal | ICD-10-CM

## 2014-08-03 DIAGNOSIS — B9681 Helicobacter pylori [H. pylori] as the cause of diseases classified elsewhere: Secondary | ICD-10-CM

## 2014-08-03 MED ORDER — AMOXICILLIN 875 MG PO TABS: 875.0000 mg | ORAL_TABLET | Freq: Two times a day (BID) | ORAL | Status: DC

## 2014-08-03 MED ORDER — LANSOPRAZOLE 30 MG PO CPDR: 30.0000 mg | DELAYED_RELEASE_CAPSULE | Freq: Every day | ORAL | Status: DC

## 2014-08-03 MED ORDER — CLARITHROMYCIN ER 500 MG PO TB24: 1000.0000 mg | ORAL_TABLET | Freq: Every day | ORAL | Status: DC

## 2014-08-03 NOTE — Progress Notes (Signed)
   Subjective:    Patient ID: Cathy Stone, female    DOB: 12/15/1975, 39 y.o.   MRN: [id number]  HPI [facility]- They ran an Hpylori test which came back positive- was called yesterday with results and told her to come here to be treated. She is still having a lot of symptoms with constant burning sensation. She is on dexilant which has not been helping the last several weeks.    Review of Systems  Constitutional: Negative.   HENT: Negative.   Respiratory: Negative.   Cardiovascular: Negative.   Gastrointestinal: Positive for nausea, vomiting and abdominal pain.  Genitourinary: Negative.   Neurological: Negative.   Psychiatric/Behavioral: Negative.   All other systems reviewed and are negative.      Objective:   Physical Exam  Constitutional: She is oriented to person, place, and time. She appears well-developed and well-nourished.  Cardiovascular: Normal rate, regular rhythm and normal heart sounds.   Pulmonary/Chest: Effort normal and breath sounds normal.  Abdominal: Soft. Bowel sounds are normal. She exhibits no distension and no mass. There is no tenderness. There is no rebound and no guarding.  Neurological: She is alert and oriented to person, place, and time.  Skin: Skin is warm.  Psychiatric: She has a normal mood and affect. Her behavior is normal. Judgment and thought content normal.   BP 126/74 mmHg  Pulse 76  Temp(Src) 98 F (36.7 C) (Oral)  Ht 5\' 7"  (1.702 m)  Wt 254 lb (115.214 kg)  BMI 39.77 kg/m2        Assessment & Plan:  1. Helicobacter positive gastritis Avoid spicy foods Do not eat 2 hours prior to bedtime - lansoprazole (PREVACID) 30 MG capsule; Take 1 capsule (30 mg total) by mouth daily at 12 noon.  Dispense: 30 capsule; Refill: 3 - amoxicillin (AMOXIL) 875 MG tablet; Take 1 tablet (875 mg total) by mouth 2 (two) times daily.  Dispense: 30 tablet; Refill: 0 - clarithromycin (BIAXIN XL) 500 MG 24 hr  tablet; Take 2 tablets (1,000 mg total) by mouth daily.  Dispense: 30 tablet; Refill: 0  Mary-Margaret Daphine DeutscherMartin, FNP

## 2014-08-03 NOTE — Patient Instructions (Signed)
Helicobacter Pylori Antibodies Test °This is a blood test which looks for a germ called Helicobacter pylori. This can also be diagnosed by a breath test or a microscopic examination of a portion (biopsy) of the small bowel. H. pylori is a germ that is found in the cells that line the stomach. It is a risk factor for stomach and small bowel ulcers, long-standing inflammation of the lining of the stomach, or even ulcers that may occur in the esophagus (the canal that runs from the mouth to the stomach). This bacterium is also a factor in stomach cancer. The amount of the bacteria is found in about 10% of healthy persons younger than 37 years of age and the amount of the bacteria increases with age. Most persons with these bacteria have no symptoms; however, it is thought that when these bacteria cause ulcers, antibiotic medications can be used to help eliminate or reduce the problem.  °PREPARATION FOR TEST °No preparation or fasting is necessary. °NORMAL FINDINGS °Negative (H. pylori bacteria not present). °Ranges for normal findings may vary among different laboratories and hospitals. You should always check with your doctor after having lab work or other tests done to discuss the meaning of your test results and whether or not your values are considered within normal limits. °MEANING OF TEST  °Your caregiver will go over the test results with you and discuss the importance and meaning of your results, as well as treatment options and the need for additional tests if necessary. °OBTAINING THE TEST RESULTS °It is your responsibility to obtain your test results. Ask the lab or department performing the test when and how you will get your results. °Document Released: 06/25/2004 Document Revised: 10/16/2013 Document Reviewed: 05/12/2008 °ExitCare® Patient Information ©2015 [employer]. This information is not intended to replace advice given to you by your health care provider. Make sure you discuss any questions you  have with your health care provider. ° °

## 2014-10-29 ENCOUNTER — Other Ambulatory Visit: Payer: Self-pay | Admitting: General Practice

## 2015-03-13 ENCOUNTER — Encounter: Payer: Self-pay | Admitting: Pediatrics

## 2015-03-13 ENCOUNTER — Ambulatory Visit (INDEPENDENT_AMBULATORY_CARE_PROVIDER_SITE_OTHER): Payer: Managed Care, Other (non HMO) | Admitting: Pediatrics

## 2015-03-13 VITALS — BP 113/74 | HR 72 | Temp 98.9°F | Ht 67.0 in | Wt 252.2 lb

## 2015-03-13 DIAGNOSIS — Z6839 Body mass index (BMI) 39.0-39.9, adult: Secondary | ICD-10-CM

## 2015-03-13 DIAGNOSIS — Z23 Encounter for immunization: Secondary | ICD-10-CM | POA: Diagnosis not present

## 2015-03-13 DIAGNOSIS — K219 Gastro-esophageal reflux disease without esophagitis: Secondary | ICD-10-CM | POA: Diagnosis not present

## 2015-03-13 DIAGNOSIS — Z0000 Encounter for general adult medical examination without abnormal findings: Secondary | ICD-10-CM

## 2015-03-13 DIAGNOSIS — R739 Hyperglycemia, unspecified: Secondary | ICD-10-CM | POA: Insufficient documentation

## 2015-03-13 DIAGNOSIS — J309 Allergic rhinitis, unspecified: Secondary | ICD-10-CM | POA: Diagnosis not present

## 2015-03-13 DIAGNOSIS — Z13220 Encounter for screening for lipoid disorders: Secondary | ICD-10-CM | POA: Diagnosis not present

## 2015-03-13 LAB — GLUCOSE, POCT (MANUAL RESULT ENTRY): POC Glucose: 118 mg/dl — AB (ref 70–99)

## 2015-03-13 MED ORDER — DEXLANSOPRAZOLE 60 MG PO CPDR: 1.0000 | DELAYED_RELEASE_CAPSULE | Freq: Every day | ORAL | Status: DC

## 2015-03-13 NOTE — Progress Notes (Signed)
    Subjective:    Patient ID: Cathy Stone, female    DOB: 09/13/1975, 39 y.o.   MRN: [id number]  HPI: Cathy Stone is a 39 y.o. female presenting on 03/13/2015 for   Pseudophed OTC, not with pseudephedrine, intermittently for nasal congestion. Allergist said seasonal problems.  When younger on an inhaler for exercise.  Starting coughing, feels like she has post nasal drip. Takes dexilant qod. Not taking allergy medicine now. Social hx: Works as Research officer, [employer].  Last year pap smear.  Fam hx: dad liver cancer, many aunts with DM2 on dads side. No breast cancer hx in family, no colon cancer. Regular periods right now, LMP: one month ago. Not on Birth control right now, not sexually active.  Daughter 26 years old. Walks while daughter is in dance, starting clogging classes. Taking plexus nutritional supplement  Relevant past medical, surgical, family and social history reviewed and updated as indicated. Interim medical history since our last visit reviewed. Allergies and medications reviewed and updated.   ROS: All other systems negative other than what is in HPI.  Past Medical History Patient Active Problem List   Diagnosis Date Noted  . Helicobacter positive gastritis 08/03/2014    Current Outpatient Prescriptions  Medication Sig Dispense Refill  . dexlansoprazole (DEXILANT) 60 MG capsule Take 1 capsule (60 mg total) by mouth daily. 30 capsule 5   No current facility-administered medications for this visit.       Objective:    BP 113/74 mmHg  Pulse 72  Temp(Src) 98.9 F (37.2 C) (Oral)  Ht  (1.702 m)  Wt 252 lb 3.2 oz (114.397 kg)  BMI 39.49 kg/m2  Wt Readings from Last 3 Encounters:  03/13/15 252 lb 3.2 oz (114.397 kg)  08/03/14 254 lb (115.214 kg)  11/24/13 251 lb (113.853 kg)    Gen: NAD, alert EYES: EOMI, no scleral injection or icterus ENT:  TMs mildly erythematous, OP without erythema LYMPH: no cervical LAD CV: NRRR, normal S1/S2, no murmur,  DP pulses 2+ b/l Resp: CTABL, no wheezes, normal WOB Abd: +BS, soft, NTND. no guarding or organomegaly Neuro: Alert and oriented, coordination grossly intact MSK: normal muscle bulk  Psych: full affect     Assessment & Plan:    Lasonja was seen today for preventive health exam.  Diagnoses and all orders for this visit:  Encounter for preventive health examination UTD on HCM, pap smear 2015, no h/o abnormals.  Screening for hyperlipidemia -     Lipid panel  Hyperglycemia H/o gestational diabetes. Elevated BGL in the past.  -     POCT glucose (manual entry) was slightly elevated at 113. Will continue lifestyle modifications.  Gastroesophageal reflux disease, esophagitis presence not specified Symptoms well controlled when takes dexlansoprazole -     dexlansoprazole (DEXILANT) 60 MG capsule; Take 1 capsule (60 mg total) by mouth daily.  BMI 39.0-39.9,adult 30 min exercise 5 days a week Healthy eating choices, fruits and vegetables, grilled not fried food Minimize fast food  Other orders -     Flu Vaccine QUAD 36+ mos IM  Allergic rhinitis  Flonase daily, zyrtec daily   Follow up plan: Return in about 1 year (around 03/12/2016).  Rex Kras, MD [facility] 03/13/2015, 8:38 AM

## 2015-03-13 NOTE — Patient Instructions (Signed)
Zyrtec or cetirizine every day Flonase two sprays each nare twice a day Dexilant every day

## 2015-03-14 LAB — LIPID PANEL
CHOLESTEROL TOTAL: 159 mg/dL (ref 100–199)
Chol/HDL Ratio: 4 ratio units (ref 0.0–4.4)
HDL: 40 mg/dL (ref >39)
LDL CALC: 105 mg/dL — AB (ref 0–99)
TRIGLYCERIDES: 71 mg/dL (ref 0–149)
VLDL Cholesterol Cal: 14 mg/dL (ref 5–40)

## 2015-03-26 ENCOUNTER — Telehealth: Payer: Self-pay | Admitting: Pediatrics

## 2015-03-26 DIAGNOSIS — T753XXA Motion sickness, initial encounter: Secondary | ICD-10-CM

## 2015-03-27 MED ORDER — SCOPOLAMINE 1 MG/3DAYS TD PT72: 1.0000 | MEDICATED_PATCH | TRANSDERMAL | Status: DC

## 2015-03-27 NOTE — Telephone Encounter (Signed)
Pt notified of RX 

## 2015-03-27 NOTE — Telephone Encounter (Signed)
Sent in 3 patches to pharmacy.

## 2015-04-12 ENCOUNTER — Encounter: Payer: Self-pay | Admitting: Nurse Practitioner

## 2015-04-12 ENCOUNTER — Ambulatory Visit (INDEPENDENT_AMBULATORY_CARE_PROVIDER_SITE_OTHER): Payer: Managed Care, Other (non HMO) | Admitting: Nurse Practitioner

## 2015-04-12 VITALS — BP 112/73 | HR 78 | Temp 97.7°F | Ht 67.0 in | Wt 247.0 lb

## 2015-04-12 DIAGNOSIS — A09 Infectious gastroenteritis and colitis, unspecified: Secondary | ICD-10-CM

## 2015-04-12 MED ORDER — CIPROFLOXACIN HCL 500 MG PO TABS: 500.0000 mg | ORAL_TABLET | Freq: Two times a day (BID) | ORAL | Status: DC

## 2015-04-12 NOTE — Patient Instructions (Signed)
Diarrhea  Diarrhea is watery poop (stool). It can make you feel weak, tired, thirsty, or give you a dry mouth (signs of dehydration). Watery poop is a sign of another problem, most often an infection. It often lasts 2-3 days. It can last longer if it is a sign of something serious. Take care of yourself as told by your doctor.  HOME CARE   · Drink 1 cup (8 ounces) of fluid each time you have watery poop.  · Do not drink the following fluids:    Those that contain simple sugars (fructose, glucose, galactose, lactose, sucrose, maltose).    Sports drinks.    Fruit juices.    Whole milk products.    Sodas.    Drinks with caffeine (coffee, tea, soda) or alcohol.  · Oral rehydration solution may be used if the doctor says it is okay. You may make your own solution. Follow this recipe:    ?-? teaspoon table salt.    ¾ teaspoon baking soda.    ? teaspoon salt substitute containing potassium chloride.    1 ? tablespoons sugar.    1 liter (34 ounces) of water.  · Avoid the following foods:    High fiber foods, such as raw fruits and vegetables.    Nuts, seeds, and whole grain breads and cereals.     Those that are sweetened with sugar alcohols (xylitol, sorbitol, mannitol).  · Try eating the following foods:    Starchy foods, such as rice, toast, pasta, low-sugar cereal, oatmeal, baked potatoes, crackers, and bagels.    Bananas.    Applesauce.  · Eat probiotic-rich foods, such as yogurt and milk products that are fermented.  · Wash your hands well after each time you have watery poop.  · Only take medicine as told by your doctor.  · Take a warm bath to help lessen burning or pain from having watery poop.  GET HELP RIGHT AWAY IF:   · You cannot drink fluids without throwing up (vomiting).  · You keep throwing up.  · You have blood in your poop, or your poop looks black and tarry.  · You do not pee (urinate) in 6-8 hours, or there is only a small amount of very dark pee.  · You have belly (abdominal) pain that gets worse or  stays in the same spot (localizes).  · You are weak, dizzy, confused, or light-headed.  · You have a very bad headache.  · Your watery poop gets worse or does not get better.  · You have a fever or lasting symptoms for more than 2-3 days.  · You have a fever and your symptoms suddenly get worse.  MAKE SURE YOU:   · Understand these instructions.  · Will watch your condition.  · Will get help right away if you are not doing well or get worse.     This information is not intended to replace advice given to you by your health care provider. Make sure you discuss any questions you have with your health care provider.     Document Released: 11/18/2007 Document Revised: 06/22/2014 Document Reviewed: 02/07/2012  Elsevier Interactive Patient Education ©2016 [employer] Inc.

## 2015-04-12 NOTE — Progress Notes (Signed)
   Subjective:    Patient ID: Cathy Stone, female    DOB: 11/04/1975, 39 y.o.   MRN: [id number]  HPI Patient just got back from a cruise on Monday and has had diarrhea since returning. Goes 4-5 times a day. Lots of belching as well. Several other people that went on cruise are experiencing the same thing.    Review of Systems  Constitutional: Negative.   HENT: Negative.   Respiratory: Negative.   Cardiovascular: Negative.   Gastrointestinal: Positive for diarrhea. Negative for nausea and vomiting.  Genitourinary: Negative.   Neurological: Negative.   Hematological: Negative.   Psychiatric/Behavioral: Negative.   All other systems reviewed and are negative.      Objective:   Physical Exam  Constitutional: She is oriented to person, place, and time. She appears well-developed and well-nourished.  Cardiovascular: Normal rate, regular rhythm and normal heart sounds.   Pulmonary/Chest: Effort normal and breath sounds normal.  Abdominal: Soft. Bowel sounds are normal.  Musculoskeletal: Normal range of motion.  Neurological: She is alert and oriented to person, place, and time.  Skin: Skin is warm and dry.  Psychiatric: She has a normal mood and affect. Her behavior is normal. Judgment and thought content normal.   BP 112/73 mmHg  Pulse 78  Temp(Src) 97.7 F (36.5 C) (Oral)  Ht 5\' 7"  (1.702 m)  Wt 247 lb (112.038 kg)  BMI 38.68 kg/m2        Assessment & Plan:   1. Diarrhea of infectious origin    Meds ordered this encounter  Medications  . ciprofloxacin (CIPRO) 500 MG tablet    Sig: Take 1 tablet (500 mg total) by mouth 2 (two) times daily.    Dispense:  20 tablet    Refill:  0    Order Specific Question:  Supervising Provider    Answer:  Deborra MedinaMOORE, DONALD W [[id number]]   Imodium AD OTC Fore fluids RTO prn  Mary-Margaret Daphine DeutscherMartin, FNP

## 2015-05-28 ENCOUNTER — Encounter: Payer: Self-pay | Admitting: Family Medicine

## 2015-05-28 ENCOUNTER — Ambulatory Visit (INDEPENDENT_AMBULATORY_CARE_PROVIDER_SITE_OTHER): Payer: Managed Care, Other (non HMO) | Admitting: Family Medicine

## 2015-05-28 VITALS — BP 120/63 | HR 70 | Temp 99.3°F | Ht 67.0 in | Wt 251.0 lb

## 2015-05-28 DIAGNOSIS — J0100 Acute maxillary sinusitis, unspecified: Secondary | ICD-10-CM | POA: Diagnosis not present

## 2015-05-28 DIAGNOSIS — R739 Hyperglycemia, unspecified: Secondary | ICD-10-CM

## 2015-05-28 DIAGNOSIS — R42 Dizziness and giddiness: Secondary | ICD-10-CM | POA: Insufficient documentation

## 2015-05-28 LAB — GLUCOSE, POCT (MANUAL RESULT ENTRY): POC GLUCOSE: 100 mg/dL — AB (ref 70–99)

## 2015-05-28 MED ORDER — AMOXICILLIN-POT CLAVULANATE 875-125 MG PO TABS: 1.0000 | ORAL_TABLET | Freq: Two times a day (BID) | ORAL | Status: DC

## 2015-05-28 NOTE — Progress Notes (Signed)
   Subjective:    Patient ID: Cathy Stone, female    DOB: 07/04/1975, [age] y.o.   MRN: [id number]  HPI 39 year old female with headaches and dizziness cough sinus pressure and some blurry vision. She seems concerned that it might be her sugar since she was told it was slightly elevated at her last visit. We did check a fasting blood sugar today which wasn't 100. I explained that I do not think that would be enough to cause her to have symptoms. The cough is productive of dark yellow sputum. She does have some ear symptoms which may be related to her lightheadedness.    Review of Systems  Constitutional: Negative.   Respiratory: Positive for cough.   Cardiovascular: Negative.   Neurological: Positive for light-headedness.      BP 120/63 mmHg  Pulse 70  Temp(Src) 99.3 F (37.4 C) (Oral)  Ht 5\' 7"  (1.702 m)  Wt 251 lb (113.853 kg)  BMI 39.30 kg/m2  LMP 05/27/2015 (Exact Date)  Objective:   Physical Exam  Constitutional: She is oriented to person, place, and time. She appears well-developed and well-nourished.  HENT:  Maxillary sinuses are tender to percussion. Left tympanic membrane is dull compared to the right  Cardiovascular: Normal rate and regular rhythm.   Pulmonary/Chest: Effort normal and breath sounds normal.  Neurological: She is alert and oriented to person, place, and time. Coordination normal.          Assessment & Plan:  1. Hyperglycemia Plan of care glucose is 100. This is not likely to be a part of her symptoms - POCT glucose (manual entry) - CBC with Differential/Platelet  2. Dizzy Some fluid in her left middle ear I think this could explain the dizziness - POCT glucose (manual entry) - CBC with Differential/Platelet  3. Acute maxillary sinusitis, recurrence not specified With amoxicillin 875 twice a day Mucinex in the daytime. Hold any antihistamines that she takes for allergies.  Frederica KusterStephen M Tristram Milian MD

## 2015-05-29 LAB — CBC WITH DIFFERENTIAL/PLATELET
BASOS ABS: 0.1 10*3/uL (ref 0.0–0.2)
BASOS: 1 %
EOS (ABSOLUTE): 0.3 10*3/uL (ref 0.0–0.4)
Eos: 3 %
Hematocrit: 44.9 % (ref 34.0–46.6)
Hemoglobin: 14.7 g/dL (ref 11.1–15.9)
Immature Grans (Abs): 0 10*3/uL (ref 0.0–0.1)
Immature Granulocytes: 0 %
LYMPHS ABS: 2.7 10*3/uL (ref 0.7–3.1)
LYMPHS: 27 %
MCH: 27.4 pg (ref 26.6–33.0)
MCHC: 32.7 g/dL (ref 31.5–35.7)
MCV: 84 fL (ref 79–97)
MONOS ABS: 0.6 10*3/uL (ref 0.1–0.9)
Monocytes: 6 %
NEUTROS ABS: 6.3 10*3/uL (ref 1.4–7.0)
Neutrophils: 63 %
PLATELETS: 281 10*3/uL (ref 150–379)
RBC: 5.36 x10E6/uL — ABNORMAL HIGH (ref 3.77–5.28)
RDW: 14.3 % (ref 12.3–15.4)
WBC: 9.9 10*3/uL (ref 3.4–10.8)

## 2015-06-29 IMAGING — CR DG CHEST 2V
2 series · 2 of 2 positions shown · non-contrast
Comparison: 09/20/2012

CLINICAL DATA: Fever, cough

EXAM:
CHEST  2 VIEW

[view not recorded (1 of 2)]
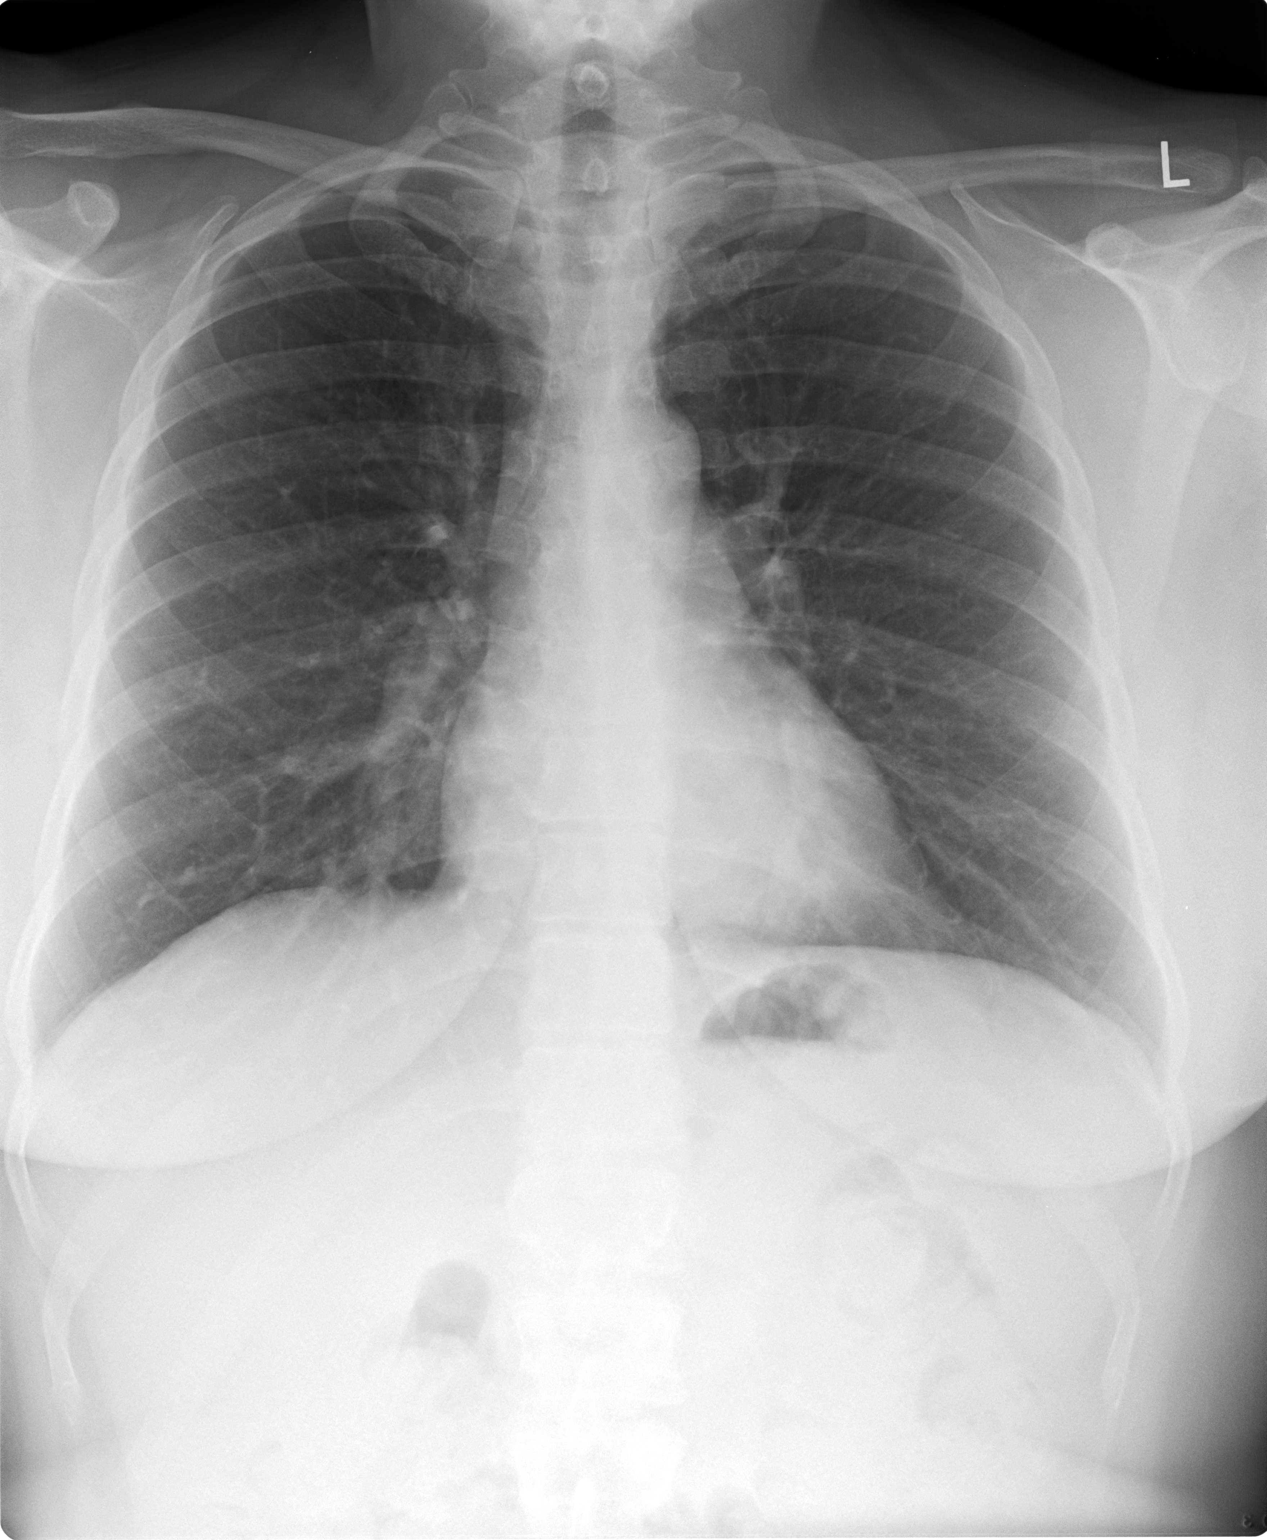

[view not recorded (2 of 2)]
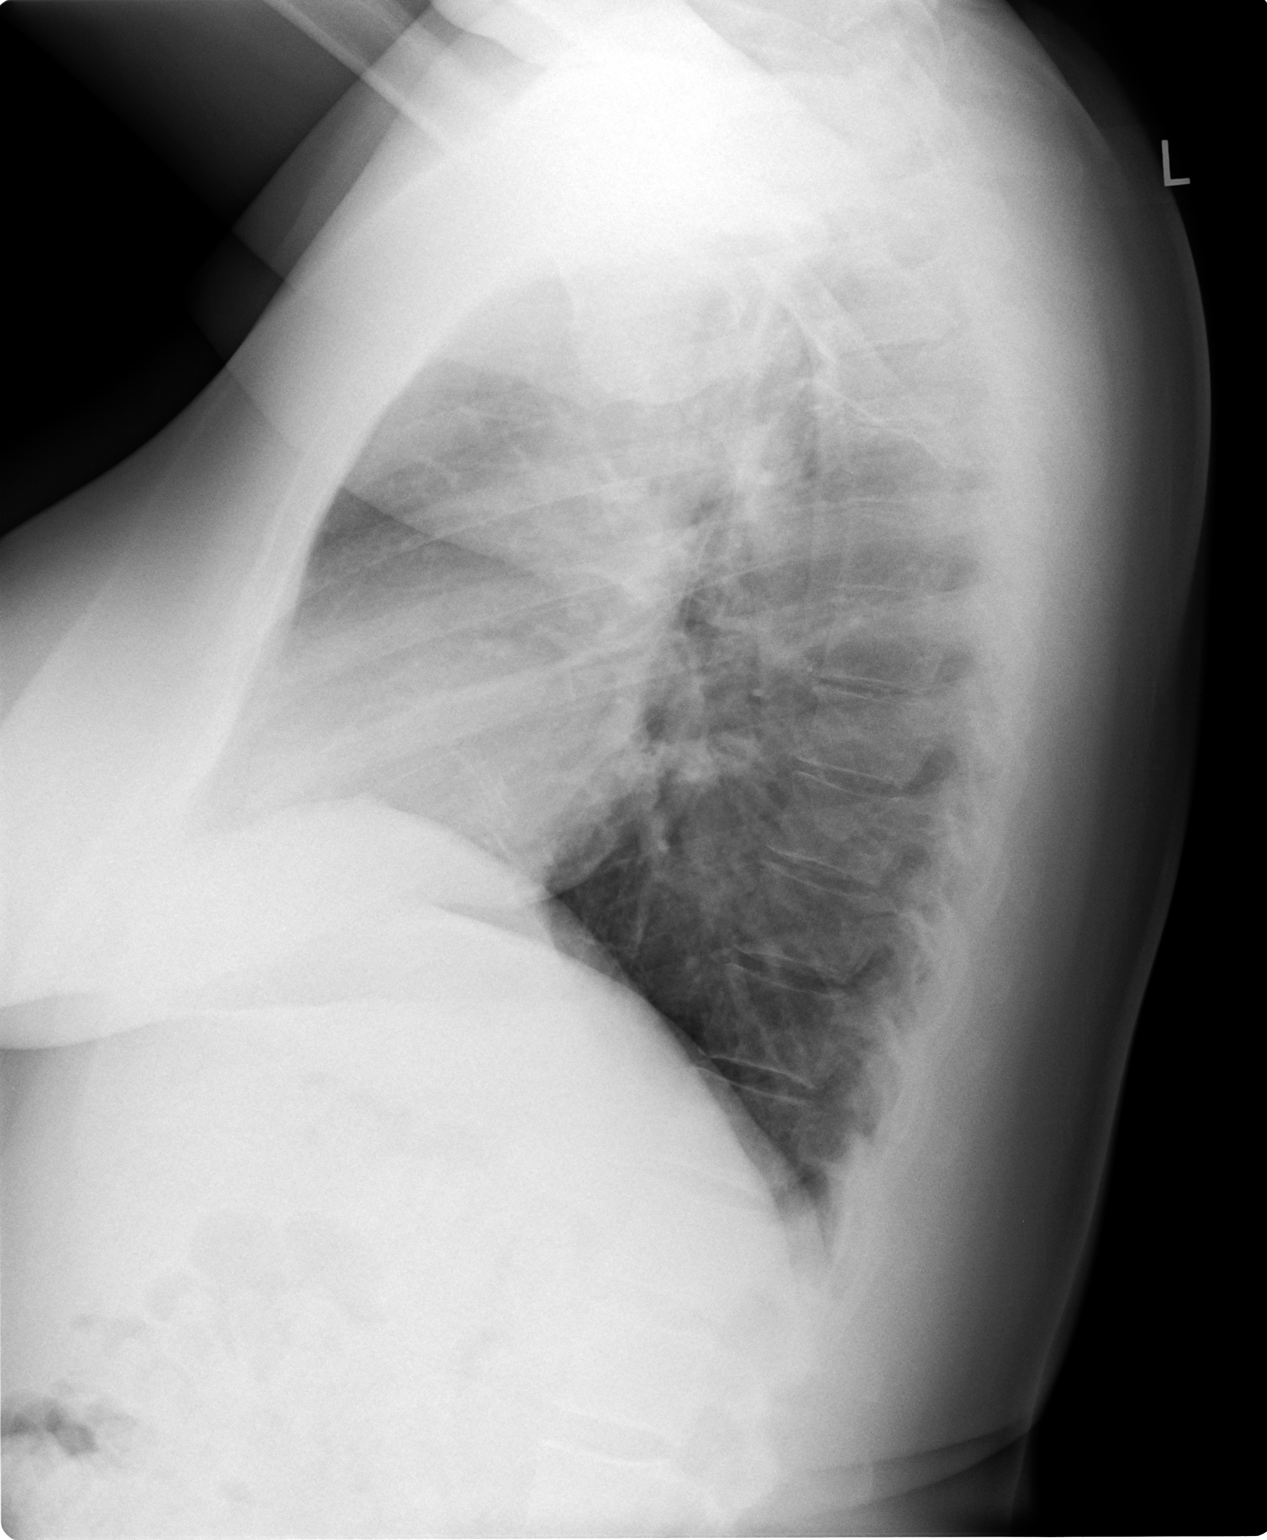

[2 of 2 positions shown; findings below may reference images not displayed]

FINDINGS: Cardiomediastinal silhouette is stable. No acute infiltrate or
pleural effusion. No pulmonary edema. Bony thorax is unremarkable.
IMPRESSION: No active cardiopulmonary disease.

## 2015-07-31 ENCOUNTER — Encounter: Payer: Managed Care, Other (non HMO) | Admitting: Pediatrics

## 2015-08-07 ENCOUNTER — Telehealth: Payer: Self-pay | Admitting: Pediatrics

## 2015-10-09 ENCOUNTER — Ambulatory Visit (INDEPENDENT_AMBULATORY_CARE_PROVIDER_SITE_OTHER): Payer: Managed Care, Other (non HMO) | Admitting: Family Medicine

## 2015-10-09 ENCOUNTER — Encounter: Payer: Self-pay | Admitting: Family Medicine

## 2015-10-09 VITALS — BP 124/83 | HR 95 | Temp 98.4°F | Ht 67.0 in | Wt 255.6 lb

## 2015-10-09 DIAGNOSIS — J020 Streptococcal pharyngitis: Secondary | ICD-10-CM

## 2015-10-09 MED ORDER — HYDROCODONE-HOMATROPINE 5-1.5 MG/5ML PO SYRP: 5.0000 mL | ORAL_SOLUTION | Freq: Four times a day (QID) | ORAL | Status: DC | PRN

## 2015-10-09 MED ORDER — AMOXICILLIN-POT CLAVULANATE 875-125 MG PO TABS: 1.0000 | ORAL_TABLET | Freq: Two times a day (BID) | ORAL | Status: DC

## 2015-10-09 NOTE — Progress Notes (Signed)
   HPI  Patient presents today here with cough.  Patient was recently started on amoxicillin, 3 days ago for strep pharyngitis. Her daughter has strep and she developed a sore throat after that.  Since that time she got better much better rapidly after starting amoxicillin, however today she's had a sharp decline and has developed a severe cough today.  She has no shortness of breath, fevers, chills, sweats. She also is tolerating food and fluids normally.  She explains that she has a "weak chest" at times, but denies overt chest pain  PMH: Smoking status noted ROS: Per HPI  Objective: BP 124/83 mmHg  Pulse 95  Temp(Src) 98.4 F (36.9 C) (Oral)  Ht 5\' 7"  (1.702 m)  Wt 255 lb 9.6 oz (115.939 kg)  BMI 40.02 kg/m2 Gen: NAD, alert, cooperative with exam HEENT: NCAT, TMs normal bilaterally, nares clear, oropharynx with swollen tonsils that are erythematous without any exudates CV: RRR, good S1/S2, no murmur Resp: CTABL, no wheezes, non-labored Ext: No edema, warm Neuro: Alert and oriented, No gross deficits  Assessment and plan:  # Pharyngitis, cough Considering improvement and then sharp decline with development of cough I will go broad antibiotic coverage to Augmentin, this would cover pneumonia Hycodan for nighttime cough Reassurance provided, return to clinic with any concerns, worsening symptoms, or failure to improve as expected.    Meds ordered this encounter  Medications  . DISCONTD: amoxicillin (AMOXIL) 500 MG capsule    Sig:   . amoxicillin-clavulanate (AUGMENTIN) 875-125 MG tablet    Sig: Take 1 tablet by mouth 2 (two) times daily.    Dispense:  20 tablet    Refill:  0  . HYDROcodone-homatropine (HYCODAN) 5-1.5 MG/5ML syrup    Sig: Take 5 mLs by mouth every 6 (six) hours as needed for cough.    Dispense:  120 mL    Refill:  0    Murtis SinkSam Bradshaw, MD [facility] 10/09/2015, 6:56 PM

## 2015-10-09 NOTE — Patient Instructions (Addendum)
Great to meet you!  Stop amox and start augmentin, this will be sure to cove pneumonia or other more severe bacterial infections  Hycodan is a strong cough medicine with narcotics in it, it is mostly for night time, do not drive after you take it.

## 2016-04-29 ENCOUNTER — Encounter: Payer: Self-pay | Admitting: Family Medicine

## 2016-04-29 ENCOUNTER — Ambulatory Visit (INDEPENDENT_AMBULATORY_CARE_PROVIDER_SITE_OTHER): Payer: Managed Care, Other (non HMO) | Admitting: Family Medicine

## 2016-04-29 VITALS — BP 102/68 | HR 64 | Temp 99.4°F | Ht 67.0 in | Wt 262.0 lb

## 2016-04-29 DIAGNOSIS — J019 Acute sinusitis, unspecified: Secondary | ICD-10-CM | POA: Diagnosis not present

## 2016-04-29 MED ORDER — AZITHROMYCIN 250 MG PO TABS: ORAL_TABLET | ORAL | 0 refills | Status: DC

## 2016-04-29 NOTE — Progress Notes (Signed)
BP 102/68   Pulse 64   Temp 99.4 F (37.4 C) (Oral)   Ht 5\' 7"  (1.702 m)   Wt 262 lb (118.8 kg)   LMP 04/08/2016 (Approximate)   BMI 41.04 kg/m    Subjective:    Patient ID: Cathy Stone, female    DOB: 07/30/1975, 40 y.o.   MRN: [id number]  HPI: Cathy Stone is a 40 y.o. female presenting on 04/29/2016 for Sinus congestion and drainage (x 3 weeks, has taken Dayquil and Nyquil) and Cough   HPI Cough and congestion Patient comes in with complaints of cough and sinus congestion and drainage that is been going on for 3 weeks. She was using DayQuil and NyQuil and it was improving but over the past few days and start worsening again. She feels like she's had some chills and some sweats overnight but no true fever. She denies any shortness of breath or wheezing. Her cough has been mostly nonproductive. She's been having a lot of sinus pressure in her forehead and postnasal drainage.  Relevant past medical, surgical, family and social history reviewed and updated as indicated. Interim medical history since our last visit reviewed. Allergies and medications reviewed and updated.  Review of Systems  Constitutional: Negative for chills and fever.  HENT: Positive for congestion, postnasal drip, rhinorrhea, sinus pressure, sneezing and sore throat. Negative for ear discharge and ear pain.   Eyes: Negative for pain, redness and visual disturbance.  Respiratory: Positive for cough. Negative for chest tightness and shortness of breath.   Cardiovascular: Negative for chest pain and leg swelling.  Genitourinary: Negative for difficulty urinating and dysuria.  Musculoskeletal: Negative for back pain and gait problem.  Skin: Negative for rash.  Neurological: Negative for light-headedness and headaches.  Psychiatric/Behavioral: Negative for agitation and behavioral problems.  All other systems reviewed and are negative.   Per HPI unless specifically indicated above     Objective:      BP 102/68   Pulse 64   Temp 99.4 F (37.4 C) (Oral)   Ht 5\' 7"  (1.702 m)   Wt 262 lb (118.8 kg)   LMP 04/08/2016 (Approximate)   BMI 41.04 kg/m   Wt Readings from Last 3 Encounters:  04/29/16 262 lb (118.8 kg)  10/09/15 255 lb 9.6 oz (115.9 kg)  05/28/15 251 lb (113.9 kg)    Physical Exam  Constitutional: She is oriented to person, place, and time. She appears well-developed and well-nourished. No distress.  HENT:  Right Ear: Tympanic membrane, external ear and ear canal normal.  Left Ear: Tympanic membrane, external ear and ear canal normal.  Nose: Mucosal edema and rhinorrhea present. No epistaxis. Right sinus exhibits no maxillary sinus tenderness and no frontal sinus tenderness. Left sinus exhibits no maxillary sinus tenderness and no frontal sinus tenderness.  Mouth/Throat: Uvula is midline and mucous membranes are normal. Posterior oropharyngeal edema and posterior oropharyngeal erythema present. No oropharyngeal exudate or tonsillar abscesses.  Eyes: Conjunctivae are normal.  Cardiovascular: Normal rate, regular rhythm, normal heart sounds and intact distal pulses.   No murmur heard. Pulmonary/Chest: Effort normal and breath sounds normal. No respiratory distress. She has no wheezes. She has no rales.  Musculoskeletal: Normal range of motion. She exhibits no edema or tenderness.  Neurological: She is alert and oriented to person, place, and time. Coordination normal.  Skin: Skin is warm and dry. No rash noted. She is not diaphoretic.  Psychiatric: She has a normal mood and affect. Her behavior is normal.  Vitals reviewed.     Assessment & Plan:   Problem List Items Addressed This Visit    None    Visit Diagnoses    Acute rhinosinusitis    -  Primary   Relevant Medications   azithromycin (ZITHROMAX) 250 MG tablet       Follow up plan: Return if symptoms worsen or fail to improve.  Counseling provided for all of the vaccine components No orders of the defined  types were placed in this encounter.   Arville CareJoshua Ariannah Arenson, MD [facility] 04/29/2016, 1:12 PM

## 2016-06-23 ENCOUNTER — Telehealth: Payer: Self-pay | Admitting: Pediatrics

## 2016-07-13 ENCOUNTER — Encounter: Payer: Managed Care, Other (non HMO) | Admitting: Pediatrics

## 2016-07-14 ENCOUNTER — Encounter: Payer: Self-pay | Admitting: Pediatrics

## 2016-07-14 ENCOUNTER — Telehealth: Payer: Self-pay | Admitting: Pediatrics

## 2016-07-28 ENCOUNTER — Ambulatory Visit (INDEPENDENT_AMBULATORY_CARE_PROVIDER_SITE_OTHER): Payer: Managed Care, Other (non HMO) | Admitting: Physician Assistant

## 2016-07-28 ENCOUNTER — Encounter: Payer: Self-pay | Admitting: Physician Assistant

## 2016-07-28 VITALS — BP 113/80 | HR 78 | Temp 98.6°F | Ht 67.0 in | Wt 262.8 lb

## 2016-07-28 DIAGNOSIS — J40 Bronchitis, not specified as acute or chronic: Secondary | ICD-10-CM

## 2016-07-28 DIAGNOSIS — K219 Gastro-esophageal reflux disease without esophagitis: Secondary | ICD-10-CM | POA: Diagnosis not present

## 2016-07-28 MED ORDER — DEXLANSOPRAZOLE 60 MG PO CPDR: 1.0000 | DELAYED_RELEASE_CAPSULE | Freq: Every day | ORAL | 11 refills | Status: DC

## 2016-07-28 MED ORDER — METHYLPREDNISOLONE ACETATE 80 MG/ML IJ SUSP: 80.0000 mg | Freq: Once | INTRAMUSCULAR | Status: DC

## 2016-07-28 MED ORDER — AZITHROMYCIN 250 MG PO TABS: ORAL_TABLET | ORAL | 1 refills | Status: DC

## 2016-07-28 NOTE — Progress Notes (Signed)
BP 113/80   Pulse 78   Temp 98.6 F (37 C) (Oral)   Ht 5\' 7"  (1.702 m)   Wt 262 lb 12.8 oz (119.2 kg)   BMI 41.16 kg/m    Subjective:    Patient ID: Cathy Stone, female    DOB: 26-Jun-1975, 41 y.o.   MRN: [id number]  HPI: Cathy Stone is a 41 y.o. female presenting on 07/28/2016 for Cough; Nasal Congestion; sinus pressure; and Ear Pain (bilateral )  Patient with several days of progressing upper respiratory and bronchial symptoms. Initially there was more upper respiratory congestion. This progressed to having significant cough that is productive throughout the day and severe at night. There is occasional wheezing after coughing. They will sometimes have slight dyspnea on exertion. It is productive mucus that is yellow in color. Denies any blood.  Relevant past medical, surgical, family and social history reviewed and updated as indicated. Allergies and medications reviewed and updated.  History reviewed. No pertinent past medical history.  Past Surgical History:  Procedure Laterality Date  . CESAREAN SECTION    . DENTAL SURGERY      Review of Systems  Constitutional: Positive for chills and fatigue. Negative for activity change and appetite change.  HENT: Positive for congestion, postnasal drip and sore throat.   Eyes: Negative.   Respiratory: Positive for cough and wheezing.   Cardiovascular: Negative.  Negative for chest pain, palpitations and leg swelling.  Gastrointestinal: Negative.   Genitourinary: Negative.   Musculoskeletal: Negative.   Skin: Negative.   Neurological: Positive for headaches.    Allergies as of 07/28/2016      Reactions   Darvocet [propoxyphene N-acetaminophen] Rash      Medication List       Accurate as of 07/28/16  6:39 PM. Always use your most recent med list.          azithromycin 250 MG tablet Commonly known as:  ZITHROMAX Z-PAK Take as directed   dexlansoprazole 60 MG capsule Commonly known as:  DEXILANT Take 1 capsule  (60 mg total) by mouth daily.          Objective:    BP 113/80   Pulse 78   Temp 98.6 F (37 C) (Oral)   Ht 5\' 7"  (1.702 m)   Wt 262 lb 12.8 oz (119.2 kg)   BMI 41.16 kg/m   Allergies  Allergen Reactions  . Darvocet [Propoxyphene N-Acetaminophen] Rash    Physical Exam  Constitutional: She is oriented to person, place, and time. She appears well-developed and well-nourished.  HENT:  Head: Normocephalic and atraumatic.  Right Ear: There is drainage and tenderness.  Left Ear: There is drainage and tenderness.  Nose: Mucosal edema and rhinorrhea present. Right sinus exhibits maxillary sinus tenderness and frontal sinus tenderness. Left sinus exhibits maxillary sinus tenderness and frontal sinus tenderness.  Mouth/Throat: Oropharyngeal exudate and posterior oropharyngeal erythema present.  Eyes: Conjunctivae and EOM are normal. Pupils are equal, round, and reactive to light.  Neck: Normal range of motion. Neck supple.  Cardiovascular: Normal rate, regular rhythm, normal heart sounds and intact distal pulses.   Pulmonary/Chest: Effort normal. She has wheezes in the right upper field and the left upper field.  Abdominal: Soft. Bowel sounds are normal.  Neurological: She is alert and oriented to person, place, and time. She has normal reflexes.  Skin: Skin is warm and dry. No rash noted.  Psychiatric: She has a normal mood and affect. Her behavior is normal. Judgment and thought  content normal.    Results for orders placed or performed in visit on 05/28/15  CBC with Differential/Platelet  Result Value Ref Range   WBC 9.9 3.4 - 10.8 x10E3/uL   RBC 5.36 (H) 3.77 - 5.28 x10E6/uL   Hemoglobin 14.7 11.1 - 15.9 g/dL   Hematocrit 16.144.9 09.634.0 - 46.6 %   MCV 84 79 - 97 fL   MCH 27.4 26.6 - 33.0 pg   MCHC 32.7 31.5 - 35.7 g/dL   RDW 04.514.3 40.912.3 - 81.115.4 %   Platelets 281 150 - 379 x10E3/uL   Neutrophils 63 %   Lymphs 27 %   Monocytes 6 %   Eos 3 %   Basos 1 %   Neutrophils Absolute  6.3 1.4 - 7.0 x10E3/uL   Lymphocytes Absolute 2.7 0.7 - 3.1 x10E3/uL   Monocytes Absolute 0.6 0.1 - 0.9 x10E3/uL   EOS (ABSOLUTE) 0.3 0.0 - 0.4 x10E3/uL   Basophils Absolute 0.1 0.0 - 0.2 x10E3/uL   Immature Granulocytes 0 %   Immature Grans (Abs) 0.0 0.0 - 0.1 x10E3/uL  POCT glucose (manual entry)  Result Value Ref Range   POC Glucose 100 (A) 70 - 99 mg/dl      Assessment & Plan:   1. Gastroesophageal reflux disease, esophagitis presence not specified - dexlansoprazole (DEXILANT) 60 MG capsule; Take 1 capsule (60 mg total) by mouth daily.  Dispense: 30 capsule; Refill: 11  2. Bronchitis - azithromycin (ZITHROMAX Z-PAK) 250 MG tablet; Take as directed  Dispense: 6 each; Refill: 1 - methylPREDNISolone acetate (DEPO-MEDROL) injection 80 mg; Inject 1 mL (80 mg total) into the muscle once.   Continue all other maintenance medications as listed above.  Follow up plan: Return if symptoms worsen or fail to improve.  Educational handout given for bronchitis  Remus LofflerAngel S. Ramiah Helfrich PA-C [facility] [address]  [place], [place] [id number] [phone]   07/28/2016, 6:39 PM

## 2016-07-28 NOTE — Patient Instructions (Signed)

## 2016-09-21 ENCOUNTER — Ambulatory Visit (INDEPENDENT_AMBULATORY_CARE_PROVIDER_SITE_OTHER): Payer: 59 | Admitting: Family

## 2016-09-21 ENCOUNTER — Encounter: Payer: Self-pay | Admitting: Family

## 2016-09-21 VITALS — BP 120/74 | HR 82 | Temp 97.8°F | Ht 67.0 in | Wt 267.0 lb

## 2016-09-21 DIAGNOSIS — R42 Dizziness and giddiness: Secondary | ICD-10-CM | POA: Diagnosis not present

## 2016-09-21 MED ORDER — MECLIZINE HCL 25 MG PO TABS: 25.0000 mg | ORAL_TABLET | Freq: Three times a day (TID) | ORAL | 0 refills | Status: DC | PRN

## 2016-09-21 MED ORDER — CETIRIZINE HCL 10 MG PO TABS: 10.0000 mg | ORAL_TABLET | Freq: Every day | ORAL | 11 refills | Status: DC

## 2016-09-21 MED ORDER — FLUTICASONE PROPIONATE 50 MCG/ACT NA SUSP: 2.0000 | Freq: Every day | NASAL | 6 refills | Status: DC

## 2016-09-21 NOTE — Progress Notes (Signed)
   Subjective:    Patient ID: Cathy Stone, female    DOB: 05/05/1976, 41 y.o.   MRN: [id number]  Dizziness  This is a new problem. The current episode started 1 to 4 weeks ago. The problem occurs constantly. The problem has been unchanged. Associated symptoms include congestion, fatigue, nausea and vertigo. Pertinent negatives include no coughing, fever, headaches, swollen glands or vomiting. The symptoms are aggravated by bending. The treatment provided mild relief.      Review of Systems  Constitutional: Positive for fatigue. Negative for fever.  HENT: Positive for congestion.   Respiratory: Negative for cough.   Gastrointestinal: Positive for nausea. Negative for vomiting.  Neurological: Positive for dizziness and vertigo. Negative for headaches.  All other systems reviewed and are negative.      Objective:   Physical Exam  Constitutional: She is oriented to person, place, and time. She appears well-developed and well-nourished. No distress.  HENT:  Head: Normocephalic and atraumatic.  Right Ear: External ear normal.  Left Ear: External ear normal. Tympanic membrane is bulging. Tympanic membrane is not perforated and not erythematous.  Nose: Mucosal edema and rhinorrhea present.  Mouth/Throat: Oropharynx is clear and moist.  Eyes: Pupils are equal, round, and reactive to light.  Neck: Normal range of motion. Neck supple. No thyromegaly present.  Cardiovascular: Normal rate, regular rhythm, normal heart sounds and intact distal pulses.   No murmur heard. Pulmonary/Chest: Effort normal and breath sounds normal. No respiratory distress. She has no wheezes.  Abdominal: Soft. Bowel sounds are normal. She exhibits no distension. There is no tenderness.  Musculoskeletal: Normal range of motion. She exhibits no edema or tenderness.  Neurological: She is alert and oriented to person, place, and time. She has normal reflexes. No cranial nerve deficit.  Skin: Skin is warm and dry.    Psychiatric: She has a normal mood and affect. Her behavior is normal. Judgment and thought content normal.  Vitals reviewed.   Temp 97.8 F (36.6 C) (Oral)   Ht  (1.702 m)   Wt 267 lb (121.1 kg)   BMI 41.82 kg/m      Assessment & Plan:  1. Vertigo Avoid position changes or bending  -Start zyrtec and Flonase -Epley exercises given to pt RTO prn  - fluticasone (FLONASE) 50 MCG/ACT nasal spray; Place 2 sprays into both nostrils daily.  Dispense: 16 g; Refill: 6 - cetirizine (ZYRTEC) 10 MG tablet; Take 1 tablet (10 mg total) by mouth daily.  Dispense: 30 tablet; Refill: 11 - meclizine (ANTIVERT) 25 MG tablet; Take 1 tablet (25 mg total) by mouth 3 (three) times daily as needed for dizziness.  Dispense: 30 tablet; Refill: 0  Jannifer Rodney, FNP

## 2016-09-21 NOTE — Patient Instructions (Signed)

## 2016-11-18 ENCOUNTER — Ambulatory Visit (INDEPENDENT_AMBULATORY_CARE_PROVIDER_SITE_OTHER): Payer: 59 | Admitting: Family

## 2016-11-18 ENCOUNTER — Encounter: Payer: Self-pay | Admitting: Family

## 2016-11-18 VITALS — BP 138/87 | HR 97 | Temp 98.1°F | Ht 67.0 in | Wt 269.0 lb

## 2016-11-18 DIAGNOSIS — J029 Acute pharyngitis, unspecified: Secondary | ICD-10-CM

## 2016-11-18 DIAGNOSIS — J020 Streptococcal pharyngitis: Secondary | ICD-10-CM

## 2016-11-18 LAB — RAPID STREP SCREEN (MED CTR MEBANE ONLY): Strep Gp A Ag, IA W/Reflex: POSITIVE — AB

## 2016-11-18 MED ORDER — AMOXICILLIN 875 MG PO TABS: 875.0000 mg | ORAL_TABLET | Freq: Two times a day (BID) | ORAL | 0 refills | Status: DC

## 2016-11-18 NOTE — Patient Instructions (Signed)
Strep Throat Strep throat is a bacterial infection of the throat. Your health care provider may call the infection tonsillitis or pharyngitis, depending on whether there is swelling in the tonsils or at the back of the throat. Strep throat is most common during the cold months of the year in children who are 5-39 years of age, but it can happen during any season in people of any age. This infection is spread from person to person (contagious) through coughing, sneezing, or close contact. What are the causes? Strep throat is caused by the bacteria called Streptococcus pyogenes. What increases the risk? This condition is more likely to develop in:  People who spend time in crowded places where the infection can spread easily.  People who have close contact with someone who has strep throat.  What are the signs or symptoms? Symptoms of this condition include:  Fever or chills.  Redness, swelling, or pain in the tonsils or throat.  Pain or difficulty when swallowing.  White or yellow spots on the tonsils or throat.  Swollen, tender glands in the neck or under the jaw.  Red rash all over the body (rare).  How is this diagnosed? This condition is diagnosed by performing a rapid strep test or by taking a swab of your throat (throat culture test). Results from a rapid strep test are usually ready in a few minutes, but throat culture test results are available after one or two days. How is this treated? This condition is treated with antibiotic medicine. Follow these instructions at home: Medicines  Take over-the-counter and prescription medicines only as told by your health care provider.  Take your antibiotic as told by your health care provider. Do not stop taking the antibiotic even if you start to feel better.  Have family members who also have a sore throat or fever tested for strep throat. They may need antibiotics if they have the strep infection. Eating and drinking  Do not  share food, drinking cups, or personal items that could cause the infection to spread to other people.  If swallowing is difficult, try eating soft foods until your sore throat feels better.  Drink enough fluid to keep your urine clear or pale yellow. General instructions  Gargle with a salt-water mixture 3-4 times per day or as needed. To make a salt-water mixture, completely dissolve -1 tsp of salt in 1 cup of warm water.  Make sure that all household members wash their hands well.  Get plenty of rest.  Stay home from school or work until you have been taking antibiotics for 24 hours.  Keep all follow-up visits as told by your health care provider. This is important. Contact a health care provider if:  The glands in your neck continue to get bigger.  You develop a rash, cough, or earache.  You cough up a thick liquid that is green, yellow-brown, or bloody.  You have pain or discomfort that does not get better with medicine.  Your problems seem to be getting worse rather than better.  You have a fever. Get help right away if:  You have new symptoms, such as vomiting, severe headache, stiff or painful neck, chest pain, or shortness of breath.  You have severe throat pain, drooling, or changes in your voice.  You have swelling of the neck, or the skin on the neck becomes red and tender.  You have signs of dehydration, such as fatigue, dry mouth, and decreased urination.  You become increasingly sleepy, or   you cannot wake up completely.  Your joints become red or painful. This information is not intended to replace advice given to you by your health care provider. Make sure you discuss any questions you have with your health care provider. Document Released: 05/29/2000 Document Revised: 01/29/2016 Document Reviewed: 09/24/2014 Elsevier Interactive Patient Education  2017 [employer] Inc[employer]  

## 2016-11-18 NOTE — Progress Notes (Signed)
   Subjective:    Patient ID: Cathy Stone, female    DOB: 08/25/1975, 41 y.o.   MRN: [id number]  Sore Throat   This is a new problem. The current episode started yesterday. The problem has been unchanged. There has been no fever. The pain is at a severity of 7/10. The pain is moderate. Associated symptoms include congestion, coughing, ear pain, a hoarse voice, a plugged ear sensation, swollen glands and trouble swallowing. Pertinent negatives include no shortness of breath. She has tried NSAIDs and acetaminophen for the symptoms. The treatment provided mild relief.  Cough  Associated symptoms include ear pain. Pertinent negatives include no shortness of breath.      Review of Systems  HENT: Positive for congestion, ear pain, hoarse voice and trouble swallowing.   Respiratory: Positive for cough. Negative for shortness of breath.   All other systems reviewed and are negative.      Objective:   Physical Exam  Constitutional: She is oriented to person, place, and time. She appears well-developed and well-nourished. No distress.  HENT:  Head: Normocephalic and atraumatic.  Right Ear: External ear normal.  Nose: Mucosal edema and rhinorrhea present.  Mouth/Throat: Posterior oropharyngeal edema and posterior oropharyngeal erythema present. No oropharyngeal exudate.  Eyes: Pupils are equal, round, and reactive to light.  Neck: Normal range of motion. Neck supple. No thyromegaly present.  Cardiovascular: Normal rate, regular rhythm, normal heart sounds and intact distal pulses.   No murmur heard. Pulmonary/Chest: Effort normal and breath sounds normal. No respiratory distress. She has no wheezes.  Abdominal: Soft. Bowel sounds are normal. She exhibits no distension. There is no tenderness.  Musculoskeletal: Normal range of motion. She exhibits no edema or tenderness.  Neurological: She is alert and oriented to person, place, and time. She has normal reflexes. No cranial nerve deficit.    Skin: Skin is warm and dry.  Psychiatric: She has a normal mood and affect. Her behavior is normal. Judgment and thought content normal.  Vitals reviewed.    BP 138/87   Pulse 97   Temp 98.1 F (36.7 C) (Oral)   Ht 5\' 7"  (1.702 m)   Wt 269 lb (122 kg)   BMI 42.13 kg/m      Assessment & Plan:  1. Sore throat - Rapid strep screen (not at [facility])  2. Strep throat - Take meds as prescribed - Use a cool mist humidifier  -Use saline nose sprays frequently -Force fluids -For any cough or congestion  Use plain Mucinex- regular strength or max strength is fine   * Children- consult with Pharmacist for dosing -For fever or aces or pains- take tylenol or ibuprofen appropriate for age and weight.  * for fevers greater than 101 orally you may alternate ibuprofen and tylenol every  3 hours. -Throat lozenges if help -New toothbrush in 3 days - amoxicillin (AMOXIL) 875 MG tablet; Take 1 tablet (875 mg total) by mouth 2 (two) times daily.  Dispense: 20 tablet; Refill: 0   Jannifer Rodneyhristy Lenvil Swaim, FNP

## 2017-03-03 ENCOUNTER — Ambulatory Visit (INDEPENDENT_AMBULATORY_CARE_PROVIDER_SITE_OTHER): Payer: 59 | Admitting: Family Medicine

## 2017-03-03 ENCOUNTER — Encounter: Payer: Self-pay | Admitting: Family Medicine

## 2017-03-03 VITALS — BP 106/67 | HR 90 | Temp 98.9°F | Ht 67.0 in | Wt 272.0 lb

## 2017-03-03 DIAGNOSIS — J329 Chronic sinusitis, unspecified: Secondary | ICD-10-CM | POA: Diagnosis not present

## 2017-03-03 DIAGNOSIS — J40 Bronchitis, not specified as acute or chronic: Secondary | ICD-10-CM | POA: Diagnosis not present

## 2017-03-03 MED ORDER — GUAIFENESIN-CODEINE 100-10 MG/5ML PO SYRP: 5.0000 mL | ORAL_SOLUTION | ORAL | 0 refills | Status: DC | PRN

## 2017-03-03 MED ORDER — LEVOFLOXACIN 500 MG PO TABS: 500.0000 mg | ORAL_TABLET | Freq: Every day | ORAL | 0 refills | Status: DC

## 2017-03-03 NOTE — Progress Notes (Signed)
Chief Complaint  Patient presents with  . Cough    pt here today c/o cough and chest congestion which she thinks is turning in bronchitis as she gets this frequently.    HPI  Patient presents today for Patient presents with upper respiratory congestion. Rhinorrhea that is frequently purulent. There is no sore throat. Patient reports coughing frequently as well.  Yellow, occasionally blood tinged sputum noted. There is no fever, chills or sweats. The patient denies being short of breath. Onset was several days ago. Gradually worsening. Seems to occur semiannually due to allergies.  PMH: Smoking status noted ROS: Per HPI  Objective: BP 106/67   Pulse 90   Temp 98.9 F (37.2 C) (Oral)   Ht  (1.702 m)   Wt 272 lb (123.4 kg)   BMI 42.60 kg/m  Gen: NAD, alert, cooperative with exam HEENT: NCAT, EOMI, PERRL CV: RRR, good S1/S2, no murmur Resp: CTABL, bronchitic with scattered rhonchi Ext: No edema, warm Neuro: Alert and oriented, No gross deficits  Assessment and plan:  1. Sinobronchitis     Meds ordered this encounter  Medications  . levofloxacin (LEVAQUIN) 500 MG tablet    Sig: Take 1 tablet (500 mg total) by mouth daily.    Dispense:  10 tablet    Refill:  0  . guaiFENesin-codeine (CHERATUSSIN AC) 100-10 MG/5ML syrup    Sig: Take 5 mLs by mouth every 4 (four) hours as needed for cough.    Dispense:  120 mL    Refill:  0    No orders of the defined types were placed in this encounter.   Follow up as needed.  Mechele Claude, MD

## 2017-04-12 ENCOUNTER — Ambulatory Visit (INDEPENDENT_AMBULATORY_CARE_PROVIDER_SITE_OTHER): Payer: 59 | Admitting: Family

## 2017-04-12 ENCOUNTER — Encounter: Payer: Self-pay | Admitting: Family

## 2017-04-12 VITALS — BP 119/71 | HR 72 | Temp 97.9°F | Ht 67.0 in | Wt 268.0 lb

## 2017-04-12 DIAGNOSIS — J209 Acute bronchitis, unspecified: Secondary | ICD-10-CM

## 2017-04-12 DIAGNOSIS — J301 Allergic rhinitis due to pollen: Secondary | ICD-10-CM | POA: Diagnosis not present

## 2017-04-12 MED ORDER — FLUTICASONE PROPIONATE 50 MCG/ACT NA SUSP
2.0000 | Freq: Every day | NASAL | 6 refills | Status: DC
Start: 2017-04-12 — End: 2018-05-27

## 2017-04-12 MED ORDER — BENZONATATE 200 MG PO CAPS: 200.0000 mg | ORAL_CAPSULE | Freq: Three times a day (TID) | ORAL | 1 refills | Status: DC | PRN

## 2017-04-12 MED ORDER — MONTELUKAST SODIUM 10 MG PO TABS: 10.0000 mg | ORAL_TABLET | Freq: Every day | ORAL | 1 refills | Status: DC

## 2017-04-12 MED ORDER — PREDNISONE 10 MG (21) PO TBPK: ORAL_TABLET | ORAL | 0 refills | Status: DC

## 2017-04-12 NOTE — Progress Notes (Signed)
   Subjective:    Patient ID: Cathy Stone, female    DOB: 09/03/1975, 41 y.o.   MRN: [id number]  Cough  This is a recurrent problem. The current episode started 1 to 4 weeks ago. The problem has been waxing and waning. The problem occurs every few minutes. The cough is productive of purulent sputum. Associated symptoms include chills, nasal congestion, rhinorrhea and shortness of breath. Pertinent negatives include no ear congestion, ear pain, fever, headaches, myalgias, sore throat or wheezing. The symptoms are aggravated by lying down. She has tried rest and OTC cough suppressant for the symptoms. The treatment provided mild relief. There is no history of asthma or COPD.      Review of Systems  Constitutional: Positive for chills. Negative for fever.  HENT: Positive for rhinorrhea. Negative for ear pain and sore throat.   Respiratory: Positive for cough and shortness of breath. Negative for wheezing.   Musculoskeletal: Negative for myalgias.  Neurological: Negative for headaches.  All other systems reviewed and are negative.      Objective:   Physical Exam  Constitutional: She is oriented to person, place, and time. She appears well-developed and well-nourished. No distress.  HENT:  Head: Normocephalic and atraumatic.  Right Ear: External ear normal.  Left Ear: External ear normal.  Nose: Mucosal edema and rhinorrhea present.  Mouth/Throat: Oropharynx is clear and moist.  Eyes: Pupils are equal, round, and reactive to light.  Neck: Normal range of motion. Neck supple. No thyromegaly present.  Cardiovascular: Normal rate, regular rhythm, normal heart sounds and intact distal pulses.   No murmur heard. Pulmonary/Chest: Effort normal and breath sounds normal. No respiratory distress. She has no wheezes.  Abdominal: Soft. Bowel sounds are normal. She exhibits no distension. There is no tenderness.  Musculoskeletal: Normal range of motion. She exhibits no edema or tenderness.    Neurological: She is alert and oriented to person, place, and time.  Skin: Skin is warm and dry.  Psychiatric: She has a normal mood and affect. Her behavior is normal. Judgment and thought content normal.  Vitals reviewed.     BP 119/71   Pulse 72   Temp 97.9 F (36.6 C) (Oral)   Ht 5\' 7"  (1.702 m)   Wt 268 lb (121.6 kg)   BMI 41.97 kg/m      Assessment & Plan:  1. Acute bronchitis, unspecified organism - Take meds as prescribed - Use a cool mist humidifier  -Use saline nose sprays frequently -Force fluids -For any cough or congestion  Use plain Mucinex- regular strength or max strength is fine -For fever or aces or pains- take tylenol or ibuprofen appropriate for age and weight. -Throat lozenges if help - predniSONE (STERAPRED UNI-PAK 21 TAB) 10 MG (21) TBPK tablet; Use as directed  Dispense: 21 tablet; Refill: 0 - fluticasone (FLONASE) 50 MCG/ACT nasal spray; Place 2 sprays into both nostrils daily.  Dispense: 16 g; Refill: 6 - benzonatate (TESSALON) 200 MG capsule; Take 1 capsule (200 mg total) by mouth 3 (three) times daily as needed.  Dispense: 60 capsule; Refill: 1  2. Allergic rhinitis due to pollen, unspecified seasonality Started on Singulair today - montelukast (SINGULAIR) 10 MG tablet; Take 1 tablet (10 mg total) by mouth at bedtime.  Dispense: 90 tablet; Refill: 1   Jannifer Rodneyhristy Koltyn Kelsay, FNP

## 2017-04-12 NOTE — Patient Instructions (Signed)

## 2017-04-21 ENCOUNTER — Telehealth: Payer: Self-pay | Admitting: Pediatrics

## 2017-04-21 NOTE — Telephone Encounter (Signed)
Continued cough and congestion No better Please advise

## 2017-04-22 MED ORDER — AZITHROMYCIN 250 MG PO TABS: ORAL_TABLET | ORAL | 0 refills | Status: DC

## 2017-04-22 NOTE — Telephone Encounter (Signed)
Zpak Prescription sent to pharmacy   

## 2017-04-22 NOTE — Telephone Encounter (Signed)
Pt aware.

## 2017-07-30 ENCOUNTER — Ambulatory Visit: Payer: 59 | Admitting: Family Medicine

## 2017-07-30 ENCOUNTER — Encounter: Payer: Self-pay | Admitting: Family Medicine

## 2017-07-30 VITALS — BP 132/85 | HR 78 | Temp 98.9°F | Ht 67.0 in | Wt 271.0 lb

## 2017-07-30 DIAGNOSIS — J0110 Acute frontal sinusitis, unspecified: Secondary | ICD-10-CM

## 2017-07-30 DIAGNOSIS — K219 Gastro-esophageal reflux disease without esophagitis: Secondary | ICD-10-CM

## 2017-07-30 MED ORDER — GUAIFENESIN-CODEINE 100-10 MG/5ML PO SYRP: 5.0000 mL | ORAL_SOLUTION | ORAL | 0 refills | Status: DC | PRN

## 2017-07-30 MED ORDER — DEXLANSOPRAZOLE 60 MG PO CPDR: 1.0000 | DELAYED_RELEASE_CAPSULE | Freq: Every day | ORAL | 1 refills | Status: DC

## 2017-07-30 MED ORDER — AZITHROMYCIN 250 MG PO TABS: ORAL_TABLET | ORAL | 0 refills | Status: DC

## 2017-07-30 NOTE — Assessment & Plan Note (Signed)
Patient need a refill and is doing well.

## 2017-07-30 NOTE — Progress Notes (Signed)
BP 132/85   Pulse 78   Temp 98.9 F (37.2 C) (Oral)   Ht 5\' 7"  (1.702 m)   Wt 271 lb (122.9 kg)   BMI 42.44 kg/m    Subjective:    Patient ID: Cathy Stone, female    DOB: 08-Jul-1975, 42 y.o.   MRN: [id number]  HPI: Cathy Stone is a 42 y.o. female presenting on 07/30/2017 for Ear pain, chest congestion, cough, sore throat (x 5 days)   HPI Cough and chest congestion and sinus pressure and right ear pain Patient is coming in with cough and congestion and sinus pressure and right ear pain that is been going on for about 5 days.  She says she wakes up with a sore throat and the cough more in the morning and in the evenings and she is having a lot of drainage down the back of her throat and down into her chest.  She feels like it is been worsening over the Past couple days.  She has been using elderberry and Flonase and continues to take her Singulair and Zyrtec but they do not seem to be helping clear this.  She does say that she there have been colds going around her family and at work but nothing specific like the flu as of yet.  She denies any fevers but has had some chills.  Relevant past medical, surgical, family and social history reviewed and updated as indicated. Interim medical history since our last visit reviewed. Allergies and medications reviewed and updated.  Review of Systems  Constitutional: Positive for chills. Negative for fever.  HENT: Positive for ear pain, postnasal drip, rhinorrhea, sinus pressure, sneezing and sore throat. Negative for congestion and ear discharge.   Eyes: Negative for pain, redness and visual disturbance.  Respiratory: Positive for cough. Negative for chest tightness, shortness of breath and wheezing.   Cardiovascular: Negative for chest pain and leg swelling.  Genitourinary: Negative for difficulty urinating and dysuria.  Musculoskeletal: Negative for back pain and gait problem.  Skin: Negative for rash.  Neurological: Negative for  light-headedness and headaches.  Psychiatric/Behavioral: Negative for agitation and behavioral problems.  All other systems reviewed and are negative.   Per HPI unless specifically indicated above      Objective:    BP 132/85   Pulse 78   Temp 98.9 F (37.2 C) (Oral)   Ht 5\' 7"  (1.702 m)   Wt 271 lb (122.9 kg)   BMI 42.44 kg/m   Wt Readings from Last 3 Encounters:  07/30/17 271 lb (122.9 kg)  04/12/17 268 lb (121.6 kg)  03/03/17 272 lb (123.4 kg)    Physical Exam  Constitutional: She is oriented to person, place, and time. She appears well-developed and well-nourished. No distress.  HENT:  Right Ear: Tympanic membrane, external ear and ear canal normal.  Left Ear: Tympanic membrane, external ear and ear canal normal.  Nose: Mucosal edema and rhinorrhea present. No epistaxis. Right sinus exhibits frontal sinus tenderness. Right sinus exhibits no maxillary sinus tenderness. Left sinus exhibits frontal sinus tenderness. Left sinus exhibits no maxillary sinus tenderness.  Mouth/Throat: Uvula is midline and mucous membranes are normal. Posterior oropharyngeal edema and posterior oropharyngeal erythema (Grade 2 tonsils) present. No oropharyngeal exudate or tonsillar abscesses.  Eyes: Conjunctivae are normal.  Cardiovascular: Normal rate, regular rhythm, normal heart sounds and intact distal pulses.  No murmur heard. Pulmonary/Chest: Effort normal and breath sounds normal. No respiratory distress. She has no wheezes. She has no rales.  Musculoskeletal: Normal range of motion. She exhibits no edema or tenderness.  Neurological: She is alert and oriented to person, place, and time. Coordination normal.  Skin: Skin is warm and dry. No rash noted. She is not diaphoretic.  Psychiatric: She has a normal mood and affect. Her behavior is normal.  Vitals reviewed.       Assessment & Plan:   Problem List Items Addressed This Visit      Digestive   Esophageal reflux    Patient need a  refill and is doing well.      Relevant Medications   dexlansoprazole (DEXILANT) 60 MG capsule    Other Visit Diagnoses    Acute non-recurrent frontal sinusitis    -  Primary   Relevant Medications   azithromycin (ZITHROMAX) 250 MG tablet   guaiFENesin-codeine (CHERATUSSIN AC) 100-10 MG/5ML syrup       Follow up plan: Return if symptoms worsen or fail to improve.  Counseling provided for all of the vaccine components No orders of the defined types were placed in this encounter.   Arville CareJoshua Elya Diloreto, MD [facility] 07/30/2017, 11:09 AM

## 2018-05-27 ENCOUNTER — Encounter: Payer: Self-pay | Admitting: Family

## 2018-05-27 ENCOUNTER — Ambulatory Visit: Payer: 59 | Admitting: Family

## 2018-05-27 VITALS — BP 127/76 | HR 87 | Temp 97.5°F | Ht 67.0 in | Wt 265.0 lb

## 2018-05-27 DIAGNOSIS — J020 Streptococcal pharyngitis: Secondary | ICD-10-CM | POA: Diagnosis not present

## 2018-05-27 DIAGNOSIS — J029 Acute pharyngitis, unspecified: Secondary | ICD-10-CM

## 2018-05-27 LAB — RAPID STREP SCREEN (MED CTR MEBANE ONLY): STREP GP A AG, IA W/REFLEX: POSITIVE — AB

## 2018-05-27 MED ORDER — FLUTICASONE PROPIONATE 50 MCG/ACT NA SUSP: 2.0000 | Freq: Every day | NASAL | 6 refills | Status: AC

## 2018-05-27 MED ORDER — AMOXICILLIN 500 MG PO CAPS: 500.0000 mg | ORAL_CAPSULE | Freq: Two times a day (BID) | ORAL | 0 refills | Status: DC

## 2018-05-27 NOTE — Progress Notes (Signed)
Subjective:    Patient ID: Cathy Stone, female    DOB: 03/07/1976, 42 y.o.   MRN: [id number]  Chief Complaint  Patient presents with  . Sore Throat  . Cough  . Sinus drainage    Sore Throat   This is a new problem. The current episode started yesterday. The problem has been gradually worsening. There has been no fever. The pain is at a severity of 5/10. The pain is mild. Associated symptoms include congestion, coughing, ear pain, headaches and swollen glands. Pertinent negatives include no ear discharge or shortness of breath. She has had no exposure to strep or mono. She has tried acetaminophen for the symptoms. The treatment provided mild relief.      Review of Systems  HENT: Positive for congestion and ear pain. Negative for ear discharge.   Respiratory: Positive for cough. Negative for shortness of breath.   Neurological: Positive for headaches.  All other systems reviewed and are negative.      Objective:   Physical Exam Vitals signs reviewed.  Constitutional:      General: She is not in acute distress.    Appearance: She is well-developed.  HENT:     Head: Normocephalic and atraumatic.     Right Ear: External ear normal.     Nose: Congestion and rhinorrhea present.     Mouth/Throat:     Pharynx: Posterior oropharyngeal erythema present.     Tonsils: Swelling: 1+ on the right. 1+ on the left.  Eyes:     Pupils: Pupils are equal, round, and reactive to light.  Neck:     Musculoskeletal: Normal range of motion and neck supple.     Thyroid: No thyromegaly.  Cardiovascular:     Rate and Rhythm: Normal rate and regular rhythm.     Heart sounds: Normal heart sounds. No murmur.  Pulmonary:     Effort: Pulmonary effort is normal. No respiratory distress.     Breath sounds: Normal breath sounds. No wheezing.     Comments: Intermittent nonproductive cough Abdominal:     General: Bowel sounds are normal. There is no distension.     Palpations: Abdomen is soft.    Tenderness: There is no abdominal tenderness.  Musculoskeletal: Normal range of motion.        General: No tenderness.  Skin:    General: Skin is warm and dry.  Neurological:     Mental Status: She is alert and oriented to person, place, and time.     Cranial Nerves: No cranial nerve deficit.     Deep Tendon Reflexes: Reflexes are normal and symmetric.  Psychiatric:        Behavior: Behavior normal.        Thought Content: Thought content normal.        Judgment: Judgment normal.       BP 127/76   Pulse 87   Temp (!) 97.5 F (36.4 C) (Oral)   Ht 5\' 7"  (1.702 m)   Wt 265 lb (120.2 kg)   BMI 41.50 kg/m      Assessment & Plan:  /[name] comes in today with chief complaint of Sore Throat; Cough; and Sinus drainage   Diagnosis and orders addressed:  1. Sore throat - Rapid Strep Screen ([facility] ONLY)  2. Strep throat - Take meds as prescribed - Use a cool mist humidifier  -Use saline nose sprays frequently -Force fluids -For any cough or congestion  Use plain Mucinex- regular strength  or max strength is fine -For fever or aces or pains- take tylenol or ibuprofen. -Throat lozenges if help -New toothbrush in 3 days RTO if symptoms worsen or not improve  - fluticasone (FLONASE) 50 MCG/ACT nasal spray; Place 2 sprays into both nostrils daily.  Dispense: 16 g; Refill: 6 - amoxicillin (AMOXIL) 500 MG capsule; Take 1 capsule (500 mg total) by mouth 2 (two) times daily.  Dispense: 20 capsule; Refill: 0   Jannifer Rodney, FNP

## 2018-05-27 NOTE — Patient Instructions (Addendum)
Strep Throat Strep throat is a bacterial infection of the throat. Your health care provider may call the infection tonsillitis or pharyngitis, depending on whether there is swelling in the tonsils or at the back of the throat. Strep throat is most common during the cold months of the year in children who are 5-40 years of age, but it can happen during any season in people of any age. This infection is spread from person to person (contagious) through coughing, sneezing, or close contact. What are the causes? Strep throat is caused by the bacteria called Streptococcus pyogenes. What increases the risk? This condition is more likely to develop in:  People who spend time in crowded places where the infection can spread easily.  People who have close contact with someone who has strep throat.  What are the signs or symptoms? Symptoms of this condition include:  Fever or chills.  Redness, swelling, or pain in the tonsils or throat.  Pain or difficulty when swallowing.  White or yellow spots on the tonsils or throat.  Swollen, tender glands in the neck or under the jaw.  Red rash all over the body (rare).  How is this diagnosed? This condition is diagnosed by performing a rapid strep test or by taking a swab of your throat (throat culture test). Results from a rapid strep test are usually ready in a few minutes, but throat culture test results are available after one or two days. How is this treated? This condition is treated with antibiotic medicine. Follow these instructions at home: Medicines  Take over-the-counter and prescription medicines only as told by your health care provider.  Take your antibiotic as told by your health care provider. Do not stop taking the antibiotic even if you start to feel better.  Have family members who also have a sore throat or fever tested for strep throat. They may need antibiotics if they have the strep infection. Eating and drinking  Do not  share food, drinking cups, or personal items that could cause the infection to spread to other people.  If swallowing is difficult, try eating soft foods until your sore throat feels better.  Drink enough fluid to keep your urine clear or pale yellow. General instructions  Gargle with a salt-water mixture 3-4 times per day or as needed. To make a salt-water mixture, completely dissolve -1 tsp of salt in 1 cup of warm water.  Make sure that all household members wash their hands well.  Get plenty of rest.  Stay home from school or work until you have been taking antibiotics for 24 hours.  Keep all follow-up visits as told by your health care provider. This is important. Contact a health care provider if:  The glands in your neck continue to get bigger.  You develop a rash, cough, or earache.  You cough up a thick liquid that is green, yellow-brown, or bloody.  You have pain or discomfort that does not get better with medicine.  Your problems seem to be getting worse rather than better.  You have a fever. Get help right away if:  You have new symptoms, such as vomiting, severe headache, stiff or painful neck, chest pain, or shortness of breath.  You have severe throat pain, drooling, or changes in your voice.  You have swelling of the neck, or the skin on the neck becomes red and tender.  You have signs of dehydration, such as fatigue, dry mouth, and decreased urination.  You become increasingly sleepy, or   you cannot wake up completely.  Your joints become red or painful. This information is not intended to replace advice given to you by your health care provider. Make sure you discuss any questions you have with your health care provider. Document Released: 05/29/2000 Document Revised: 01/29/2016 Document Reviewed: 09/24/2014 Elsevier Interactive Patient Education  2018 [employer]  

## 2018-08-25 ENCOUNTER — Encounter: Payer: Self-pay | Admitting: Family Medicine

## 2018-08-25 ENCOUNTER — Ambulatory Visit: Payer: 59 | Admitting: Family Medicine

## 2018-08-25 ENCOUNTER — Other Ambulatory Visit: Payer: Self-pay

## 2018-08-25 VITALS — BP 127/74 | HR 88 | Temp 97.0°F | Ht 67.0 in | Wt 269.2 lb

## 2018-08-25 DIAGNOSIS — J101 Influenza due to other identified influenza virus with other respiratory manifestations: Secondary | ICD-10-CM | POA: Diagnosis not present

## 2018-08-25 DIAGNOSIS — K219 Gastro-esophageal reflux disease without esophagitis: Secondary | ICD-10-CM | POA: Diagnosis not present

## 2018-08-25 DIAGNOSIS — R42 Dizziness and giddiness: Secondary | ICD-10-CM | POA: Diagnosis not present

## 2018-08-25 LAB — VERITOR FLU A/B WAIVED
INFLUENZA B: NEGATIVE
Influenza A: POSITIVE — AB

## 2018-08-25 MED ORDER — OSELTAMIVIR PHOSPHATE 75 MG PO CAPS: 75.0000 mg | ORAL_CAPSULE | Freq: Two times a day (BID) | ORAL | 0 refills | Status: DC

## 2018-08-25 MED ORDER — CETIRIZINE HCL 10 MG PO TABS: 10.0000 mg | ORAL_TABLET | Freq: Every day | ORAL | 0 refills | Status: AC

## 2018-08-25 MED ORDER — DEXLANSOPRAZOLE 60 MG PO CPDR: 1.0000 | DELAYED_RELEASE_CAPSULE | Freq: Every day | ORAL | 0 refills | Status: DC

## 2018-08-25 NOTE — Progress Notes (Signed)
BP 127/74   Pulse 88   Temp (!) 97 F (36.1 C) (Oral)   Ht 5\' 7"  (1.702 m)   Wt 269 lb 3.2 oz (122.1 kg)   SpO2 99%   BMI 42.16 kg/m    Subjective:    Patient ID: Cathy Stone, female    DOB: [birth year], 43 y.o.   MRN: [id number]  HPI: Cathy Stone is a 43 y.o. female presenting on 08/25/2018 for Cough (x 2 days- otc sudafed); Nasal Congestion; and facial pressure   HPI Cough and nasal congestion and facial pressure Patient comes in today complaining of cough and nasal congestion and facial pressure this been going on for the past 2 days.  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  She denies having any fevers but has had chills and body aches and a cough that is productive of yellow-green sputum.  She has tried some Flonase and her allergy medicine but they do not seem to be helping at this point.  She has not had any recent travel and denies any fevers.  Relevant past medical, surgical, family and social history reviewed and updated as indicated. Interim medical history since our last visit reviewed. Allergies and medications reviewed and updated.  Review of Systems  Constitutional: Negative for chills and fever.  HENT: Positive for congestion, postnasal drip, rhinorrhea, sinus pressure, sneezing and sore throat. Negative for ear discharge and ear pain.   Eyes: Negative for pain, redness and visual disturbance.  Respiratory: Positive for cough. Negative for chest tightness, shortness of breath and wheezing.   Cardiovascular: Negative for chest pain and leg swelling.  Genitourinary: Negative for difficulty urinating and dysuria.  Musculoskeletal: Negative for back pain and gait problem.  Skin: Negative for rash.  Neurological: Negative for light-headedness and headaches.  Psychiatric/Behavioral: Negative for agitation and behavioral  problems.  All other systems reviewed and are negative.   Per HPI unless specifically indicated above   Allergies as of 08/25/2018      Reactions   Darvocet [propoxyphene N-acetaminophen] Rash      Medication List       Accurate as of August 25, 2018 11:05 AM. Always use your most recent med list.        cetirizine 10 MG tablet Commonly known as:  ZYRTEC Take 1 tablet (10 mg total) by mouth daily.   dexlansoprazole 60 MG capsule Commonly known as:  Dexilant Take 1 capsule (60 mg total) by mouth daily.   fluticasone 50 MCG/ACT nasal spray Commonly known as:  FLONASE Place 2 sprays into both nostrils daily.   oseltamivir 75 MG capsule Commonly known as:  Tamiflu Take 1 capsule (75 mg total) by mouth 2 (two) times daily.          Objective:    BP 127/74   Pulse 88   Temp (!) 97 F (36.1 C) (Oral)   Ht 5\' 7"  (1.702 m)   Wt 269 lb 3.2 oz (122.1 kg)   SpO2 99%   BMI 42.16 kg/m   Wt Readings from Last 3 Encounters:  08/25/18 269 lb 3.2 oz (122.1 kg)  05/27/18 265 lb (120.2 kg)  07/30/17 271 lb (122.9 kg)    Physical Exam Vitals signs and nursing note reviewed.  Constitutional:      General: She is not in acute  distress.    Appearance: She is well-developed. She is not diaphoretic.  HENT:     Right Ear: Tympanic membrane, ear canal and external ear normal.     Left Ear: Tympanic membrane, ear canal and external ear normal.     Nose: Mucosal edema and rhinorrhea present.     Right Sinus: Maxillary sinus tenderness present. No frontal sinus tenderness.     Left Sinus: Maxillary sinus tenderness present. No frontal sinus tenderness.     Mouth/Throat:     Pharynx: Uvula midline. Posterior oropharyngeal erythema present. No oropharyngeal exudate.     Tonsils: No tonsillar abscesses.  Eyes:     Conjunctiva/sclera: Conjunctivae normal.  Cardiovascular:     Rate and Rhythm: Normal rate and regular rhythm.     Heart sounds: Normal heart sounds. No murmur.   Pulmonary:     Effort: Pulmonary effort is normal. No respiratory distress.     Breath sounds: Normal breath sounds. No wheezing.  Musculoskeletal: Normal range of motion.        General: No tenderness.  Skin:    General: Skin is warm and dry.     Findings: No rash.  Neurological:     Mental Status: She is alert and oriented to person, place, and time.     Coordination: Coordination normal.  Psychiatric:        Behavior: Behavior normal.     Rapid flu: Positive    Assessment & Plan:   Problem List Items Addressed This Visit      Digestive   Esophageal reflux   Relevant Medications   dexlansoprazole (DEXILANT) 60 MG capsule    Other Visit Diagnoses    Influenza A    -  Primary   Relevant Medications   oseltamivir (TAMIFLU) 75 MG capsule   Other Relevant Orders   Veritor Flu A/B Waived   Veritor Flu A/B Waived   Vertigo       Relevant Medications   cetirizine (ZYRTEC) 10 MG tablet      Gave refill on Dexilant and Zyrtec, patient is to schedule a physical and Pap smear Follow up plan: Return in about 2 months (around 10/25/2018), or if symptoms worsen or fail to improve, for Well woman exam and Pap and physical.  Counseling provided for all of the vaccine components Orders Placed This Encounter  Procedures  . Veritor Flu A/B Waived  . Veritor Flu A/B Reynolds Bowl, MD [facility] 08/25/2018, 11:05 AM

## 2018-10-18 ENCOUNTER — Encounter: Payer: Self-pay | Admitting: Family

## 2018-10-18 ENCOUNTER — Ambulatory Visit (INDEPENDENT_AMBULATORY_CARE_PROVIDER_SITE_OTHER): Payer: 59 | Admitting: Family

## 2018-10-18 ENCOUNTER — Other Ambulatory Visit: Payer: Self-pay

## 2018-10-18 DIAGNOSIS — J301 Allergic rhinitis due to pollen: Secondary | ICD-10-CM | POA: Diagnosis not present

## 2018-10-18 DIAGNOSIS — J069 Acute upper respiratory infection, unspecified: Secondary | ICD-10-CM | POA: Diagnosis not present

## 2018-10-18 MED ORDER — BENZONATATE 200 MG PO CAPS: 200.0000 mg | ORAL_CAPSULE | Freq: Three times a day (TID) | ORAL | 1 refills | Status: DC | PRN

## 2018-10-18 NOTE — Progress Notes (Signed)
   Virtual Visit via telephone Note  I connected with Cathy Stone on 10/18/18 at 10:47 AM by telephone and verified that I am speaking with the correct person using two identifiers. Cathy Stone is currently located at home and no one is currently with her during visit. The provider, Jannifer Rodney, FNP is located in their office at time of visit.  I discussed the limitations, risks, security and privacy concerns of performing an evaluation and management service by telephone and the availability of in person appointments. I also discussed with the patient that there may be a patient responsible charge related to this service. The patient expressed understanding and agreed to proceed.   History and Present Illness:  Otalgia   There is pain in the right ear. This is a new problem. The current episode started in the past 7 days. The problem occurs every few minutes. The problem has been waxing and waning. There has been no fever. The pain is at a severity of 4/10. The pain is mild. Associated symptoms include coughing, headaches (sinus pressure) and rhinorrhea. Pertinent negatives include no ear discharge, hearing loss, sore throat or vomiting. She has tried nothing for the symptoms. The treatment provided no relief.      Review of Systems  HENT: Positive for ear pain and rhinorrhea. Negative for ear discharge, hearing loss and sore throat.   Respiratory: Positive for cough.   Gastrointestinal: Negative for vomiting.  Neurological: Positive for headaches (sinus pressure).  All other systems reviewed and are negative.    Observations/Objective: No SOB or distress noted, Intermittent nonproductive cough  Assessment and Plan: 1. Viral upper respiratory tract infection - Take meds as prescribed - Use a cool mist humidifier  -Use saline nose sprays frequently -Force fluids -For any cough or congestion  Use plain Mucinex- regular strength or max strength is fine -For fever or aces  or pains- take tylenol or ibuprofen. -Throat lozenges if help -Call office if symptoms worsen or do not improve - benzonatate (TESSALON) 200 MG capsule; Take 1 capsule (200 mg total) by mouth 3 (three) times daily as needed.  Dispense: 30 capsule; Refill: 1  2. Allergic rhinitis due to pollen, unspecified seasonality Continue Zyrtec and Flonase     I discussed the assessment and treatment plan with the patient. The patient was provided an opportunity to ask questions and all were answered. The patient agreed with the plan and demonstrated an understanding of the instructions.   The patient was advised to call back or seek an in-person evaluation if the symptoms worsen or if the condition fails to improve as anticipated.  The above assessment and management plan was discussed with the patient. The patient verbalized understanding of and has agreed to the management plan. Patient is aware to call the clinic if symptoms persist or worsen. Patient is aware when to return to the clinic for a follow-up visit. Patient educated on when it is appropriate to go to the emergency department.   Time call ended:  10:55Am  I provided 12 minutes of non-face-to-face time during this encounter.    Jannifer Rodney, FNP

## 2019-05-10 ENCOUNTER — Ambulatory Visit (INDEPENDENT_AMBULATORY_CARE_PROVIDER_SITE_OTHER): Payer: BC Managed Care – PPO | Admitting: Family Medicine

## 2019-05-10 ENCOUNTER — Encounter: Payer: Self-pay | Admitting: Family Medicine

## 2019-05-10 ENCOUNTER — Other Ambulatory Visit: Payer: Self-pay

## 2019-05-10 DIAGNOSIS — J329 Chronic sinusitis, unspecified: Secondary | ICD-10-CM | POA: Diagnosis not present

## 2019-05-10 DIAGNOSIS — J40 Bronchitis, not specified as acute or chronic: Secondary | ICD-10-CM

## 2019-05-10 MED ORDER — AMOXICILLIN-POT CLAVULANATE 875-125 MG PO TABS: 1.0000 | ORAL_TABLET | Freq: Two times a day (BID) | ORAL | 0 refills | Status: DC

## 2019-05-10 MED ORDER — PSEUDOEPHEDRINE-GUAIFENESIN ER 120-1200 MG PO TB12: 1.0000 | ORAL_TABLET | Freq: Two times a day (BID) | ORAL | 0 refills | Status: DC

## 2019-05-10 MED ORDER — PREDNISONE 10 MG PO TABS: ORAL_TABLET | ORAL | 0 refills | Status: DC

## 2019-05-10 NOTE — Progress Notes (Signed)
Subjective:    Patient ID: Cathy Stone, female    DOB: 06-09-1976, 43 y.o.   MRN: [id number]   HPI: Cathy Stone is a 43 y.o. female presenting for bad allergy and sinus drainage. Has gone into her chest. Occurs a couple of times a year. No dyspnea. Cough productive of thick yellow to green sputum. Copious amounts caused vomiting last night. No fever.Chest feels tight with a deep breath. Having sinnus pessure around the eyes.  Onset was last night, except that sinus drainage is chronic.    Depression screen [facility] 2/9 08/25/2018 05/27/2018 07/30/2017 04/12/2017 03/03/2017  Decreased Interest 0 0 0 0 0  Down, Depressed, Hopeless 0 0 0 0 0  PHQ - 2 Score 0 0 0 0 0     Relevant past medical, surgical, family and social history reviewed and updated as indicated.  Interim medical history since our last visit reviewed. Allergies and medications reviewed and updated.  ROS:  Review of Systems   Social History   Tobacco Use  Smoking Status Never Smoker  Smokeless Tobacco Never Used       Objective:     Wt Readings from Last 3 Encounters:  08/25/18 269 lb 3.2 oz (122.1 kg)  05/27/18 265 lb (120.2 kg)  07/30/17 271 lb (122.9 kg)     Exam deferred. Pt. Harboring due to COVID 19. Phone visit performed.   Assessment & Plan:   1. Sinobronchitis     Meds ordered this encounter  Medications  . Pseudoephedrine-Guaifenesin [phone] MG TB12    Sig: Take 1 tablet by mouth 2 (two) times daily. For congestion    Dispense:  20 tablet    Refill:  0  . predniSONE (DELTASONE) 10 MG tablet    Sig: Take 5 daily for 2 days followed by 4,3,2 and 1 for 2 days each.    Dispense:  30 tablet    Refill:  0  . amoxicillin-clavulanate (AUGMENTIN) 875-125 MG tablet    Sig: Take 1 tablet by mouth 2 (two) times daily. Take all of this medication    Dispense:  20 tablet    Refill:  0    No orders of the defined types were placed in this encounter.     Diagnoses and all orders for this  visit:  Sinobronchitis  Other orders -     Pseudoephedrine-Guaifenesin [phone] MG TB12; Take 1 tablet by mouth 2 (two) times daily. For congestion -     predniSONE (DELTASONE) 10 MG tablet; Take 5 daily for 2 days followed by 4,3,2 and 1 for 2 days each. -     amoxicillin-clavulanate (AUGMENTIN) 875-125 MG tablet; Take 1 tablet by mouth 2 (two) times daily. Take all of this medication    Virtual Visit via telephone Note  I discussed the limitations, risks, security and privacy concerns of performing an evaluation and management service by telephone and the availability of in person appointments. The patient was identified with two identifiers. Pt.expressed understanding and agreed to proceed. Pt. Is at home. Dr. Livia Snellen is in his office.  Follow Up Instructions:   I discussed the assessment and treatment plan with the patient. The patient was provided an opportunity to ask questions and all were answered. The patient agreed with the plan and demonstrated an understanding of the instructions.   The patient was advised to call back or seek an in-person evaluation if the symptoms worsen or if the condition fails to improve as anticipated.   Total  minutes including chart review and phone contact time: 14   Follow up plan: Return if symptoms worsen or fail to improve.  Cathy Fraise, MD [place]

## 2019-05-23 DIAGNOSIS — Z20828 Contact with and (suspected) exposure to other viral communicable diseases: Secondary | ICD-10-CM | POA: Diagnosis not present

## 2020-02-26 ENCOUNTER — Ambulatory Visit (INDEPENDENT_AMBULATORY_CARE_PROVIDER_SITE_OTHER): Payer: BC Managed Care – PPO | Admitting: Family Medicine

## 2020-02-26 ENCOUNTER — Encounter: Payer: Self-pay | Admitting: Family Medicine

## 2020-02-26 DIAGNOSIS — J329 Chronic sinusitis, unspecified: Secondary | ICD-10-CM | POA: Diagnosis not present

## 2020-02-26 DIAGNOSIS — J40 Bronchitis, not specified as acute or chronic: Secondary | ICD-10-CM | POA: Diagnosis not present

## 2020-02-26 MED ORDER — AMOXICILLIN-POT CLAVULANATE 875-125 MG PO TABS: 1.0000 | ORAL_TABLET | Freq: Two times a day (BID) | ORAL | 0 refills | Status: AC

## 2020-02-26 NOTE — Patient Instructions (Signed)
Sinusitis, Adult Sinusitis is inflammation of your sinuses. Sinuses are hollow spaces in the bones around your face. Your sinuses are located:  Around your eyes.  In the middle of your forehead.  Behind your nose.  In your cheekbones. Mucus normally drains out of your sinuses. When your nasal tissues become inflamed or swollen, mucus can become trapped or blocked. This allows bacteria, viruses, and fungi to grow, which leads to infection. Most infections of the sinuses are caused by a virus. Sinusitis can develop quickly. It can last for up to 4 weeks (acute) or for more than 12 weeks (chronic). Sinusitis often develops after a cold. What are the causes? This condition is caused by anything that creates swelling in the sinuses or stops mucus from draining. This includes:  Allergies.  Asthma.  Infection from bacteria or viruses.  Deformities or blockages in your nose or sinuses.  Abnormal growths in the nose (nasal polyps).  Pollutants, such as chemicals or irritants in the air.  Infection from fungi (rare). What increases the risk? You are more likely to develop this condition if you:  Have a weak body defense system (immune system).  Do a lot of swimming or diving.  Overuse nasal sprays.  Smoke. What are the signs or symptoms? The main symptoms of this condition are pain and a feeling of pressure around the affected sinuses. Other symptoms include:  Stuffy nose or congestion.  Thick drainage from your nose.  Swelling and warmth over the affected sinuses.  Headache.  Upper toothache.  A cough that may get worse at night.  Extra mucus that collects in the throat or the back of the nose (postnasal drip).  Decreased sense of smell and taste.  Fatigue.  A fever.  Sore throat.  Bad breath. How is this diagnosed? This condition is diagnosed based on:  Your symptoms.  Your medical history.  A physical exam.  Tests to find out if your condition is  acute or chronic. This may include: ? Checking your nose for nasal polyps. ? Viewing your sinuses using a device that has a light (endoscope). ? Testing for allergies or bacteria. ? Imaging tests, such as an MRI or CT scan. In rare cases, a bone biopsy may be done to rule out more serious types of fungal sinus disease. How is this treated? Treatment for sinusitis depends on the cause and whether your condition is chronic or acute.  If caused by a virus, your symptoms should go away on their own within 10 days. You may be given medicines to relieve symptoms. They include: ? Medicines that shrink swollen nasal passages (topical intranasal decongestants). ? Medicines that treat allergies (antihistamines). ? A spray that eases inflammation of the nostrils (topical intranasal corticosteroids). ? Rinses that help get rid of thick mucus in your nose (nasal saline washes).  If caused by bacteria, your health care provider may recommend waiting to see if your symptoms improve. Most bacterial infections will get better without antibiotic medicine. You may be given antibiotics if you have: ? A severe infection. ? A weak immune system.  If caused by narrow nasal passages or nasal polyps, you may need to have surgery. Follow these instructions at home: Medicines  Take, use, or apply over-the-counter and prescription medicines only as told by your health care provider. These may include nasal sprays.  If you were prescribed an antibiotic medicine, take it as told by your health care provider. Do not stop taking the antibiotic even if you start   to feel better. Hydrate and humidify   Drink enough fluid to keep your urine pale yellow. Staying hydrated will help to thin your mucus.  Use a cool mist humidifier to keep the humidity level in your home above 50%.  Inhale steam for 10-15 minutes, 3-4 times a day, or as told by your health care provider. You can do this in the bathroom while a hot shower is  running.  Limit your exposure to cool or dry air. Rest  Rest as much as possible.  Sleep with your head raised (elevated).  Make sure you get enough sleep each night. General instructions   Apply a warm, moist washcloth to your face 3-4 times a day or as told by your health care provider. This will help with discomfort.  Wash your hands often with soap and water to reduce your exposure to germs. If soap and water are not available, use hand sanitizer.  Do not smoke. Avoid being around people who are smoking (secondhand smoke).  Keep all follow-up visits as told by your health care provider. This is important. Contact a health care provider if:  You have a fever.  Your symptoms get worse.  Your symptoms do not improve within 10 days. Get help right away if:  You have a severe headache.  You have persistent vomiting.  You have severe pain or swelling around your face or eyes.  You have vision problems.  You develop confusion.  Your neck is stiff.  You have trouble breathing. Summary  Sinusitis is soreness and inflammation of your sinuses. Sinuses are hollow spaces in the bones around your face.  This condition is caused by nasal tissues that become inflamed or swollen. The swelling traps or blocks the flow of mucus. This allows bacteria, viruses, and fungi to grow, which leads to infection.  If you were prescribed an antibiotic medicine, take it as told by your health care provider. Do not stop taking the antibiotic even if you start to feel better.  Keep all follow-up visits as told by your health care provider. This is important. This information is not intended to replace advice given to you by your health care provider. Make sure you discuss any questions you have with your health care provider. Document Revised: 11/01/2017 Document Reviewed: 11/01/2017 Elsevier Patient Education  2020 [employer]  

## 2020-02-26 NOTE — Progress Notes (Signed)
Virtual Visit via telephone Note Due to COVID-19 pandemic this visit was conducted virtually. This visit type was conducted due to national recommendations for restrictions regarding the COVID-19 Pandemic (e.g. social distancing, sheltering in place) in an effort to limit this patient's exposure and mitigate transmission in our community. All issues noted in this document were discussed and addressed.  A physical exam was not performed with this format.  I connected with Cathy Stone on 02/26/20 at 1005 by telephone and verified that I am speaking with the correct person using two identifiers. Cathy Stone is currently located at home and no one is currently with her during the visit. The provider, Gabriel Earing, FNP is located in their office at time of visit.  I discussed the limitations, risks, security and privacy concerns of performing an evaluation and management service by telephone and the availability of in person appointments. I also discussed with the patient that there may be a patient responsible charge related to this service. The patient expressed understanding and agreed to proceed.  Chief complain: cough, congestion  History and Present Illness:  HPI  Cathy Stone reports cough, congestion, fatigue, and hoarseness x2-3 weeks with worsening 5 days ago. She denies fever, shortness of breath, or chest pain. She was tested for Covid last week with both a rapid and PCR test and reports that both were negative. She works from home and denies possible exposure since negative test. She was given a prescriptions for prednisone with little relief. She has tried flonase, allergy medicine, and OTC cough medicine with some relief. She reports she typically gets a case of bronchitis requiring antibiotics about once a year.   Review of Systems  Constitutional: Positive for malaise/fatigue. Negative for chills and fever.  HENT: Positive for congestion and sinus pain.   Respiratory:  Positive for cough. Negative for shortness of breath, wheezing and stridor.   Cardiovascular: Negative for chest pain and palpitations.  Gastrointestinal: Negative for diarrhea, nausea and vomiting.  Musculoskeletal: Negative for myalgias.  Skin: Negative for rash.     Observations/Objective: Alert and oriented x 3. Cough and hoarseness noted. Able to speak in complete sentences without difficulty.   Assessment and Plan: Diagnoses and all orders for this visit:  Sinobronchitis Given symptoms for 2-3 weeks with worsening symptoms 5 days ago and negative PCR for Covid, will treat with antibiotic. Continue OTC cough medication, allergy medicine, and flonase. Follow up if symptoms worsen or do not improve.  -     amoxicillin-clavulanate (AUGMENTIN) 875-125 MG tablet; Take 1 tablet by mouth 2 (two) times daily for 10 days.   Follow Up Instructions: Follow up as needed if symptoms worsen or do not improve.     I discussed the assessment and treatment plan with the patient. The patient was provided an opportunity to ask questions and all were answered. The patient agreed with the plan and demonstrated an understanding of the instructions.   The patient was advised to call back or seek an in-person evaluation if the symptoms worsen or if the condition fails to improve as anticipated.  The above assessment and management plan was discussed with the patient. The patient verbalized understanding of and has agreed to the management plan. Patient is aware to call the clinic if symptoms persist or worsen. Patient is aware when to return to the clinic for a follow-up visit. Patient educated on when it is appropriate to go to the emergency department.   Time call ended:  1021  I provided 16 minutes of non-face-to-face time during this encounter.    Gabriel Earing, FNP

## 2020-05-28 ENCOUNTER — Encounter: Payer: Self-pay | Admitting: Family

## 2020-05-28 ENCOUNTER — Ambulatory Visit (INDEPENDENT_AMBULATORY_CARE_PROVIDER_SITE_OTHER): Payer: BC Managed Care – PPO | Admitting: Family

## 2020-05-28 DIAGNOSIS — U071 COVID-19: Secondary | ICD-10-CM

## 2020-05-28 MED ORDER — DEXAMETHASONE 6 MG PO TABS: 6.0000 mg | ORAL_TABLET | Freq: Two times a day (BID) | ORAL | 0 refills | Status: DC

## 2020-05-28 MED ORDER — ALBUTEROL SULFATE HFA 108 (90 BASE) MCG/ACT IN AERS: 2.0000 | INHALATION_SPRAY | Freq: Four times a day (QID) | RESPIRATORY_TRACT | 2 refills | Status: DC | PRN

## 2020-05-28 NOTE — Progress Notes (Signed)
   Virtual Visit via telephone Note Due to COVID-19 pandemic this visit was conducted virtually. This visit type was conducted due to national recommendations for restrictions regarding the COVID-19 Pandemic (e.g. social distancing, sheltering in place) in an effort to limit this patient's exposure and mitigate transmission in our community. All issues noted in this document were discussed and addressed.  A physical exam was not performed with this format.  I connected with Cathy Stone on 05/28/20 at 12:13 pm  by telephone and verified that I am speaking with the correct person using two identifiers. Cathy Stone is currently located at home and no one  is currently with her  during visit. The provider, Jannifer Rodney, FNP is located in their office at time of visit.  I discussed the limitations, risks, security and privacy concerns of performing an evaluation and management service by telephone and the availability of in person appointments. I also discussed with the patient that there may be a patient responsible charge related to this service. The patient expressed understanding and agreed to proceed.   History and Present Illness:  Cough This is a new problem. The current episode started 1 to 4 weeks ago. The problem has been waxing and waning. The cough is productive of sputum. Associated symptoms include chills, ear congestion, a fever, nasal congestion and postnasal drip. Pertinent negatives include no ear pain, headaches, myalgias, sore throat, shortness of breath, weight loss or wheezing. The symptoms are aggravated by lying down. She has tried rest for the symptoms. The treatment provided mild relief.      Review of Systems  Constitutional: Positive for chills and fever. Negative for weight loss.  HENT: Positive for postnasal drip. Negative for ear pain and sore throat.   Respiratory: Positive for cough. Negative for shortness of breath and wheezing.   Musculoskeletal: Negative  for myalgias.  Neurological: Negative for headaches.     Observations/Objective: No SOB or distress noted, hoarse voice and intermittent coarse cough  Assessment and Plan: 1. COVID-19 virus detected Rest Force fluids Tylenol as needed  Call if symptoms worsen or do not improve  - albuterol (VENTOLIN HFA) 108 (90 Base) MCG/ACT inhaler; Inhale 2 puffs into the lungs every 6 (six) hours as needed for wheezing or shortness of breath.  Dispense: 8 g; Refill: 2 - dexamethasone (DECADRON) 6 MG tablet; Take 1 tablet (6 mg total) by mouth 2 (two) times daily.  Dispense: 14 tablet; Refill: 0 - MyChart COVID-19 home monitoring program; Future     I discussed the assessment and treatment plan with the patient. The patient was provided an opportunity to ask questions and all were answered. The patient agreed with the plan and demonstrated an understanding of the instructions.   The patient was advised to call back or seek an in-person evaluation if the symptoms worsen or if the condition fails to improve as anticipated.  The above assessment and management plan was discussed with the patient. The patient verbalized understanding of and has agreed to the management plan. Patient is aware to call the clinic if symptoms persist or worsen. Patient is aware when to return to the clinic for a follow-up visit. Patient educated on when it is appropriate to go to the emergency department.   Time call ended: 12:24 pm    I provided 11 minutes of non-face-to-face time during this encounter.    Jannifer Rodney, FNP

## 2020-05-31 ENCOUNTER — Telehealth: Payer: Self-pay

## 2020-05-31 MED ORDER — AZITHROMYCIN 250 MG PO TABS: ORAL_TABLET | ORAL | 0 refills | Status: DC

## 2020-05-31 NOTE — Telephone Encounter (Signed)
Patient aware and verbalizes understanding. 

## 2020-05-31 NOTE — Telephone Encounter (Signed)
Pt had televisit with Christy on 05/28/20 because she tested positive for COVID. Per patients My Chart Message, pt is requesting medicine be sent in for her to help with her cough and congestion. Possibly a Zpak.  Please advise and call patient.

## 2020-05-31 NOTE — Telephone Encounter (Signed)
Zpak Prescription sent to pharmacy, needs to complete dexamethasone rx.

## 2020-06-04 ENCOUNTER — Telehealth: Payer: Self-pay

## 2020-06-04 ENCOUNTER — Other Ambulatory Visit: Payer: Self-pay | Admitting: Family

## 2020-06-04 NOTE — Telephone Encounter (Signed)
The mychart message was sent to [facility] - will wait for her reply.

## 2020-06-04 NOTE — Telephone Encounter (Signed)
Patient sent this message via My Chart- please advise and call patient.  What can I take to help with this cough and major sinus pain/pressure? The sinus drainage is still awful.   Can I get a Dr note for work for last week and whatever is needed this week? I am still just so weak and tired from Covid. How long will I continue to test positive?   Thanks

## 2020-06-13 ENCOUNTER — Encounter: Payer: Self-pay | Admitting: Nurse Practitioner

## 2020-06-13 ENCOUNTER — Ambulatory Visit (INDEPENDENT_AMBULATORY_CARE_PROVIDER_SITE_OTHER): Payer: BC Managed Care – PPO | Admitting: Nurse Practitioner

## 2020-06-13 ENCOUNTER — Other Ambulatory Visit: Payer: Self-pay

## 2020-06-13 DIAGNOSIS — F5101 Primary insomnia: Secondary | ICD-10-CM

## 2020-06-13 DIAGNOSIS — K219 Gastro-esophageal reflux disease without esophagitis: Secondary | ICD-10-CM

## 2020-06-13 DIAGNOSIS — R059 Cough, unspecified: Secondary | ICD-10-CM | POA: Diagnosis not present

## 2020-06-13 MED ORDER — DEXILANT 60 MG PO CPDR: 1.0000 | DELAYED_RELEASE_CAPSULE | Freq: Every day | ORAL | 0 refills | Status: DC

## 2020-06-13 MED ORDER — PROMETHAZINE-DM 6.25-15 MG/5ML PO SYRP: 5.0000 mL | ORAL_SOLUTION | Freq: Four times a day (QID) | ORAL | 0 refills | Status: DC | PRN

## 2020-06-13 MED ORDER — PREDNISONE 20 MG PO TABS: ORAL_TABLET | ORAL | 0 refills | Status: DC

## 2020-06-13 NOTE — Progress Notes (Signed)
Virtual Visit via telephone Note Due to COVID-19 pandemic this visit was conducted virtually. This visit type was conducted due to national recommendations for restrictions regarding the COVID-19 Pandemic (e.g. social distancing, sheltering in place) in an effort to limit this patient's exposure and mitigate transmission in our community. All issues noted in this document were discussed and addressed.  A physical exam was not performed with this format.  I connected with Cathy Stone on 06/13/20 at 11:25 by telephone and verified that I am speaking with the correct person using two identifiers. Cathy Stone is currently located at home and no one is currently with her during visit. The provider, Mary-Margaret Daphine Deutscher, FNP is located in their office at time of visit.  I discussed the limitations, risks, security and privacy concerns of performing an evaluation and management service by telephone and the availability of in person appointments. I also discussed with the patient that there may be a patient responsible charge related to this service. The patient expressed understanding and agreed to proceed.   History and Present Illness:   Chief Complaint: Cough   HPI Patient calls in for an appointment. She says she tested positive for covid on 05/26/20. She is some better but her cough is no better. It is productive at times. Feels like mucus is in her throat.   Review of Systems  Constitutional: Negative for chills and fever.  HENT: Positive for congestion. Negative for sore throat.   Respiratory: Positive for cough and sputum production. Negative for shortness of breath.   Neurological: Positive for dizziness. Negative for headaches.  All other systems reviewed and are negative.    Observations/Objective: Alert and oriented- answers all questions appropriately No distress Voice hoarse Deep dry cough throughout visit   Assessment and Plan: Cathy Stone in today with  chief complaint of Cough   1. Cough Force fluids humidifier - predniSONE (DELTASONE) 20 MG tablet; 2 po at sametime daily for 5 days  Dispense: 10 tablet; Refill: 0 - promethazine-dextromethorphan (PROMETHAZINE-DM) 6.25-15 MG/5ML syrup; Take 5 mLs by mouth 4 (four) times daily as needed for cough.  Dispense: 118 mL; Refill: 0  2. Primary insomnia Bedtime routine unisom OTC  3. Gastroesophageal reflux disease Avoid spicy foods Do not eat 2 hours prior to bedtime Refilled meds  - dexlansoprazole (DEXILANT) 60 MG capsule; Take 1 capsule (60 mg total) by mouth daily.  Dispense: 90 capsule; Refill: 0    The above assessment and management plan was discussed with the patient. The patient verbalized understanding of and has agreed to the management plan. Patient is aware to call the clinic if symptoms persist or worsen. Patient is aware when to return to the clinic for a follow-up visit. Patient educated on when it is appropriate to go to the emergency department.   Mary-Margaret Daphine Deutscher, FNP    Follow Up Instructions: prn    I discussed the assessment and treatment plan with the patient. The patient was provided an opportunity to ask questions and all were answered. The patient agreed with the plan and demonstrated an understanding of the instructions.   The patient was advised to call back or seek an in-person evaluation if the symptoms worsen or if the condition fails to improve as anticipated.  The above assessment and management plan was discussed with the patient. The patient verbalized understanding of and has agreed to the management plan. Patient is aware to call the clinic if symptoms persist or worsen. Patient is aware when  to return to the clinic for a follow-up visit. Patient educated on when it is appropriate to go to the emergency department.   Time call ended:  11:40  I provided 15 minutes of non-face-to-face time during this encounter.    Mary-Margaret Daphine Deutscher,  FNP

## 2020-06-18 ENCOUNTER — Telehealth: Payer: Self-pay | Admitting: *Deleted

## 2020-06-18 MED ORDER — OMEPRAZOLE 40 MG PO CPDR: 40.0000 mg | DELAYED_RELEASE_CAPSULE | Freq: Every day | ORAL | 3 refills | Status: DC

## 2020-06-18 NOTE — Telephone Encounter (Signed)
Patient aware.

## 2020-06-18 NOTE — Telephone Encounter (Signed)
Changed dexilant to omeprazole 40mg  daily- prescription sent to [facility].

## 2020-06-18 NOTE — Telephone Encounter (Signed)
Dexilant 60 mg Cap is not covered by pt plan.  Alternatives given were (covered without PA): Pantoprazole Sod Omeprazole  Famotidine Rabeprazole Sod

## 2020-08-14 ENCOUNTER — Encounter: Payer: Self-pay | Admitting: Family Medicine

## 2020-08-14 ENCOUNTER — Ambulatory Visit (INDEPENDENT_AMBULATORY_CARE_PROVIDER_SITE_OTHER): Payer: BC Managed Care – PPO | Admitting: Family Medicine

## 2020-08-14 ENCOUNTER — Other Ambulatory Visit: Payer: Self-pay

## 2020-08-14 VITALS — BP 116/72 | HR 79 | Temp 98.2°F | Ht 67.0 in | Wt 284.2 lb

## 2020-08-14 DIAGNOSIS — Z8616 Personal history of COVID-19: Secondary | ICD-10-CM

## 2020-08-14 DIAGNOSIS — Z9189 Other specified personal risk factors, not elsewhere classified: Secondary | ICD-10-CM

## 2020-08-14 DIAGNOSIS — L819 Disorder of pigmentation, unspecified: Secondary | ICD-10-CM

## 2020-08-14 NOTE — Progress Notes (Signed)
Assessment & Plan:  1-2. Discoloration of skin of hand/History of COVID-19 Labs to look for identifiable cause of symptoms. - [facility] Comprehensive Panel - CBC with Differential/Platelet - TSH - CMP14+EGFR - Sedimentation rate - C-reactive protein  3. At risk for sleep apnea Discussed swollen hands as a possible symptom of sleep apnea. In combination with weight and fatigue, this is a possibility. Advised to keep in mind and discuss with PCP if she is interested in a sleep study.    Follow up plan: Return if symptoms worsen or fail to improve.  Hendricks Limes, MSN, APRN, FNP-C [facility]  Subjective:   Patient ID: Cathy Stone, female    DOB: [birth year], 45 y.o.   MRN: [id number]  HPI: Cathy Stone is a 45 y.o. female presenting on 08/14/2020 for bilateral hands turning blue  (Patient states that last night at dance class her hands turned blue on and off for 10 mins./)  Patient reports last night at dance class her hands turned splotchy and blue.  This occurred for 10 minutes and then resolved.  She states she felt completely normal when it occurred.  Denies any chest pain, shortness of breath, pain in her hands, numbness in her hands, and cold hands.  This is the first time this has ever occurred.  She has been dancing since she was in her 55s.  She does report in the morning her hands are red and swollen.  This has been going on since she had COVID-19 in December.  She is unaware if she snores, but states she does wake up multiple times throughout the night for unknown reasons.  She does feel fatigued during the day.   ROS: Negative unless specifically indicated above in HPI.   Relevant past medical history reviewed and updated as indicated.   Allergies and medications reviewed and updated.   Current Outpatient Medications:  .  cetirizine (ZYRTEC) 10 MG tablet, Take 1 tablet (10 mg total) by mouth daily., Disp: 90 tablet, Rfl: 0 .  fluticasone  (FLONASE) 50 MCG/ACT nasal spray, Place 2 sprays into both nostrils daily., Disp: 16 g, Rfl: 6 .  omeprazole (PRILOSEC) 40 MG capsule, Take 1 capsule (40 mg total) by mouth daily., Disp: 30 capsule, Rfl: 3  Allergies  Allergen Reactions  . Darvocet [Propoxyphene N-Acetaminophen] Rash    Objective:   BP 116/72   Pulse 79   Temp 98.2 F (36.8 C) (Temporal)   Ht _0  (1.702 m)   Wt 284 lb 3.2 oz (128.9 kg)   SpO2 99%   BMI 44.51 kg/m    Physical Exam Vitals reviewed.  Constitutional:      General: She is not in acute distress.    Appearance: Normal appearance. She is morbidly obese. She is not ill-appearing, toxic-appearing or diaphoretic.  HENT:     Head: Normocephalic and atraumatic.  Eyes:     General: No scleral icterus.       Right eye: No discharge.        Left eye: No discharge.     Conjunctiva/sclera: Conjunctivae normal.  Cardiovascular:     Rate and Rhythm: Normal rate.  Pulmonary:     Effort: Pulmonary effort is normal. No respiratory distress.  Musculoskeletal:        General: Normal range of motion.     Cervical back: Normal range of motion.  Skin:    General: Skin is warm and dry.     Capillary Refill: Capillary refill  takes less than 2 seconds.  Neurological:     General: No focal deficit present.     Mental Status: She is alert and oriented to person, place, and time. Mental status is at baseline.  Psychiatric:        Mood and Affect: Mood normal.        Behavior: Behavior normal.        Thought Content: Thought content normal.        Judgment: Judgment normal.

## 2020-08-15 ENCOUNTER — Encounter: Payer: Self-pay | Admitting: Family Medicine

## 2020-08-15 DIAGNOSIS — I7300 Raynaud's syndrome without gangrene: Secondary | ICD-10-CM

## 2020-08-15 LAB — CMP14+EGFR
ALT: 22 IU/L (ref 0–32)
AST: 24 IU/L (ref 0–40)
Albumin/Globulin Ratio: 1.3 (ref 1.2–2.2)
Albumin: 4.3 g/dL (ref 3.8–4.8)
Alkaline Phosphatase: 100 IU/L (ref 44–121)
BUN/Creatinine Ratio: 10 (ref 9–23)
BUN: 9 mg/dL (ref 6–24)
Bilirubin Total: 0.3 mg/dL (ref 0.0–1.2)
CO2: 23 mmol/L (ref 20–29)
Calcium: 9.3 mg/dL (ref 8.7–10.2)
Chloride: 100 mmol/L (ref 96–106)
Creatinine, Ser: 0.86 mg/dL (ref 0.57–1.00)
Globulin, Total: 3.3 g/dL (ref 1.5–4.5)
Glucose: 94 mg/dL (ref 65–99)
Potassium: 4.6 mmol/L (ref 3.5–5.2)
Sodium: 140 mmol/L (ref 134–144)
Total Protein: 7.6 g/dL (ref 6.0–8.5)
eGFR: 86 mL/min/{1.73_m2} (ref >59)

## 2020-08-15 LAB — SEDIMENTATION RATE: Sed Rate: 89 mm/hr — ABNORMAL HIGH (ref 0–32)

## 2020-08-15 LAB — ANA COMPREHENSIVE PANEL
Anti JO-1: <0.2 AI (ref 0.0–0.9)
Centromere Ab Screen: <0.2 AI (ref 0.0–0.9)
Chromatin Ab SerPl-aCnc: <0.2 AI (ref 0.0–0.9)
ENA RNP Ab: 0.2 AI (ref 0.0–0.9)
ENA SM Ab Ser-aCnc: <0.2 AI (ref 0.0–0.9)
ENA SSA (RO) Ab: <0.2 AI (ref 0.0–0.9)
ENA SSB (LA) Ab: <0.2 AI (ref 0.0–0.9)
Scleroderma (Scl-70) (ENA) Antibody, IgG: <0.2 AI (ref 0.0–0.9)
dsDNA Ab: <1 IU/mL (ref 0–9)

## 2020-08-15 LAB — CBC WITH DIFFERENTIAL/PLATELET
Basophils Absolute: 0.2 10*3/uL (ref 0.0–0.2)
Basos: 1 %
EOS (ABSOLUTE): 0.3 10*3/uL (ref 0.0–0.4)
Eos: 2 %
Hematocrit: 41.6 % (ref 34.0–46.6)
Hemoglobin: 13.8 g/dL (ref 11.1–15.9)
Immature Grans (Abs): 0 10*3/uL (ref 0.0–0.1)
Immature Granulocytes: 0 %
Lymphocytes Absolute: 5.1 10*3/uL — ABNORMAL HIGH (ref 0.7–3.1)
Lymphs: 36 %
MCH: 27.8 pg (ref 26.6–33.0)
MCHC: 33.2 g/dL (ref 31.5–35.7)
MCV: 84 fL (ref 79–97)
Monocytes Absolute: 1.2 10*3/uL — ABNORMAL HIGH (ref 0.1–0.9)
Monocytes: 8 %
Neutrophils Absolute: 7.3 10*3/uL — ABNORMAL HIGH (ref 1.4–7.0)
Neutrophils: 53 %
Platelets: 549 10*3/uL — ABNORMAL HIGH (ref 150–450)
RBC: 4.97 x10E6/uL (ref 3.77–5.28)
RDW: 13.5 % (ref 11.7–15.4)
WBC: 14.1 10*3/uL — ABNORMAL HIGH (ref 3.4–10.8)

## 2020-08-15 LAB — TSH: TSH: 2.81 u[IU]/mL (ref 0.450–4.500)

## 2020-08-15 LAB — C-REACTIVE PROTEIN: CRP: 21 mg/L — ABNORMAL HIGH (ref 0–10)

## 2020-08-18 MED ORDER — AMLODIPINE BESYLATE 5 MG PO TABS: 5.0000 mg | ORAL_TABLET | Freq: Every day | ORAL | 3 refills | Status: DC

## 2020-11-21 ENCOUNTER — Ambulatory Visit (INDEPENDENT_AMBULATORY_CARE_PROVIDER_SITE_OTHER): Payer: BC Managed Care – PPO | Admitting: Nurse Practitioner

## 2020-11-21 DIAGNOSIS — J40 Bronchitis, not specified as acute or chronic: Secondary | ICD-10-CM

## 2020-11-21 MED ORDER — AZITHROMYCIN 250 MG PO TABS: ORAL_TABLET | ORAL | 0 refills | Status: DC

## 2020-11-21 MED ORDER — PREDNISONE 20 MG PO TABS: 40.0000 mg | ORAL_TABLET | Freq: Every day | ORAL | 0 refills | Status: AC

## 2020-11-21 NOTE — Progress Notes (Signed)
Virtual Visit  Note Due to COVID-19 pandemic this visit was conducted virtually. This visit type was conducted due to national recommendations for restrictions regarding the COVID-19 Pandemic (e.g. social distancing, sheltering in place) in an effort to limit this patient's exposure and mitigate transmission in our community. All issues noted in this document were discussed and addressed.  A physical exam was not performed with this format.  I connected with Cathy Stone on 11/21/20 at 11:48 by telephone and verified that I am speaking with the correct person using two identifiers. Cathy Stone is currently located at home and no one is currently with her during visit. The provider, Mary-Margaret Daphine Deutscher, FNP is located in their office at time of visit.  I discussed the limitations, risks, security and privacy concerns of performing an evaluation and management service by telephone and the availability of in person appointments. I also discussed with the patient that there may be a patient responsible charge related to this service. The patient expressed understanding and agreed to proceed.   History and Present Illness:  Chief Complaint: Sinusitis   HPI Patient calls in c/o sinus drainage for over a week. Has now gone down into her sick. Cough has b=gotten really deep the last several days. Clear to yellowish mucus. Has been taking  flonase, allergy meds and nucinex. Helps some with symptoms.     Review of Systems  Constitutional:  Negative for chills and fever.  HENT:  Positive for congestion and sore throat. Negative for sinus pain.   Respiratory:  Positive for cough and sputum production. Negative for shortness of breath.   Musculoskeletal:  Negative for myalgias.    Observations/Objective: Alert and oriented- answers all questions appropriately No distress Deep dry cough noted  Assessment and Plan: Cathy Stone in today with chief complaint of Sinusitis   1.  Bronchitis 1. Take meds as prescribed 2. Use a cool mist humidifier especially during the winter months and when heat has been humid. 3. Use saline nose sprays frequently 4. Saline irrigations of the nose can be very helpful if done frequently.  * 4X daily for 1 week*  * Use of a nettie pot can be helpful with this. Follow directions with this* 5. Drink plenty of fluids 6. Keep thermostat turn down low 7.For any cough or congestion  Use plain Mucinex- regular strength or max strength is fine   * Children- consult with Pharmacist for dosing 8. For fever or aces or pains- take tylenol or ibuprofen appropriate for age and weight.  * for fevers greater than 101 orally you may alternate ibuprofen and tylenol every  3 hours.   Meds ordered this encounter  Medications   azithromycin (ZITHROMAX Z-PAK) 250 MG tablet    Sig: As directed    Dispense:  6 tablet    Refill:  0    Order Specific Question:   Supervising Provider    Answer:   Arville Care A [[id number]]   predniSONE (DELTASONE) 20 MG tablet    Sig: Take 2 tablets (40 mg total) by mouth daily with breakfast for 5 days. 2 po daily for 5 days    Dispense:  10 tablet    Refill:  0    Order Specific Question:   Supervising Provider    Answer:   Arville Care A [[id number]]       Follow Up Instructions: prn    I discussed the assessment and treatment plan with the patient. The patient was provided  an opportunity to ask questions and all were answered. The patient agreed with the plan and demonstrated an understanding of the instructions.   The patient was advised to call back or seek an in-person evaluation if the symptoms worsen or if the condition fails to improve as anticipated.  The above assessment and management plan was discussed with the patient. The patient verbalized understanding of and has agreed to the management plan. Patient is aware to call the clinic if symptoms persist or worsen. Patient is aware when to  return to the clinic for a follow-up visit. Patient educated on when it is appropriate to go to the emergency department.   Time call ended:  2:00  I provided 12 minutes of  non face-to-face time during this encounter.    Mary-Margaret Daphine Deutscher, FNP

## 2021-03-27 ENCOUNTER — Ambulatory Visit (INDEPENDENT_AMBULATORY_CARE_PROVIDER_SITE_OTHER): Payer: BC Managed Care – PPO | Admitting: Family Medicine

## 2021-03-27 ENCOUNTER — Encounter: Payer: Self-pay | Admitting: Family Medicine

## 2021-03-27 DIAGNOSIS — B9689 Other specified bacterial agents as the cause of diseases classified elsewhere: Secondary | ICD-10-CM

## 2021-03-27 DIAGNOSIS — J988 Other specified respiratory disorders: Secondary | ICD-10-CM

## 2021-03-27 NOTE — Progress Notes (Signed)
   Virtual Visit via Telephone Note  I connected with Cathy Stone on 03/27/21 at 8:25 AM by telephone and verified that I am speaking with the correct person using two identifiers. Cathy Stone is currently located in her vehicle and nobody is currently with her during this visit. The provider, Gwenlyn Fudge, FNP is located in their office at time of visit.  I discussed the limitations, risks, security and privacy concerns of performing an evaluation and management service by telephone and the availability of in person appointments. I also discussed with the patient that there may be a patient responsible charge related to this service. The patient expressed understanding and agreed to proceed.  Subjective: PCP: Bennie Pierini, FNP  Chief Complaint  Patient presents with   Cough   Patient complains of cough, postnasal drainage, and hoarseness . Onset of symptoms was 1 week ago, gradually improving since that time. She is drinking plenty of fluids. Evaluation to date: previously had an on-demand visit and was prescribed and antibiotic and steroid which she has been taking as prescribed. She does not smoke.    ROS: Per HPI  Current Outpatient Medications:    amLODipine (NORVASC) 5 MG tablet, Take 1 tablet (5 mg total) by mouth daily., Disp: 90 tablet, Rfl: 3   cetirizine (ZYRTEC) 10 MG tablet, Take 1 tablet (10 mg total) by mouth daily., Disp: 90 tablet, Rfl: 0   fluticasone (FLONASE) 50 MCG/ACT nasal spray, Place 2 sprays into both nostrils daily., Disp: 16 g, Rfl: 6   omeprazole (PRILOSEC) 40 MG capsule, Take 1 capsule (40 mg total) by mouth daily., Disp: 30 capsule, Rfl: 3  Allergies  Allergen Reactions   Darvocet [Propoxyphene N-Acetaminophen] Rash   History reviewed. No pertinent past medical history.  Observations/Objective: A&O  No respiratory distress or wheezing audible over the phone Mood, judgement, and thought processes all WNL  Assessment and Plan: 1.  Bacterial respiratory infection Patient is already on antibiotics and has completed a round of steroids. Encouraged continued symptom management until symptoms resolve as she is improving.   Follow Up Instructions:  I discussed the assessment and treatment plan with the patient. The patient was provided an opportunity to ask questions and all were answered. The patient agreed with the plan and demonstrated an understanding of the instructions.   The patient was advised to call back or seek an in-person evaluation if the symptoms worsen or if the condition fails to improve as anticipated.  The above assessment and management plan was discussed with the patient. The patient verbalized understanding of and has agreed to the management plan. Patient is aware to call the clinic if symptoms persist or worsen. Patient is aware when to return to the clinic for a follow-up visit. Patient educated on when it is appropriate to go to the emergency department.   Time call ended: 8:36 AM  I provided 11 minutes of non-face-to-face time during this encounter.  Deliah Boston, MSN, APRN, FNP-C [facility] 03/27/21

## 2021-04-10 ENCOUNTER — Ambulatory Visit (INDEPENDENT_AMBULATORY_CARE_PROVIDER_SITE_OTHER): Payer: BC Managed Care – PPO

## 2021-04-10 ENCOUNTER — Telehealth (INDEPENDENT_AMBULATORY_CARE_PROVIDER_SITE_OTHER): Payer: BC Managed Care – PPO | Admitting: Family Medicine

## 2021-04-10 ENCOUNTER — Encounter: Payer: Self-pay | Admitting: Family Medicine

## 2021-04-10 DIAGNOSIS — R0989 Other specified symptoms and signs involving the circulatory and respiratory systems: Secondary | ICD-10-CM

## 2021-04-10 DIAGNOSIS — J40 Bronchitis, not specified as acute or chronic: Secondary | ICD-10-CM

## 2021-04-10 DIAGNOSIS — J209 Acute bronchitis, unspecified: Secondary | ICD-10-CM | POA: Diagnosis not present

## 2021-04-10 MED ORDER — DEXAMETHASONE 2 MG PO TABS: ORAL_TABLET | ORAL | 0 refills | Status: DC

## 2021-04-10 MED ORDER — LEVOFLOXACIN 500 MG PO TABS: 500.0000 mg | ORAL_TABLET | Freq: Every day | ORAL | 0 refills | Status: AC

## 2021-04-10 NOTE — Progress Notes (Signed)
Virtual Visit via mychart video Note  I connected with Cathy Stone on 04/10/21 at 1415 by video and verified that I am speaking with the correct person using two identifiers. Cathy Stone is currently located at home and patient are currently with her during visit. The provider, Elige Radon Anastasia Tompson, MD is located in their office at time of visit.  Call ended at 1428  I discussed the limitations, risks, security and privacy concerns of performing an evaluation and management service by video and the availability of in person appointments. I also discussed with the patient that there may be a patient responsible charge related to this service. The patient expressed understanding and agreed to proceed.   History and Present Illness: Patient is calling in for cough and congestion.  She went to [facility] in early October and was treated since then for symptoms.  She has a mother who was ill with pneumonia this week. She did 2 rounds of antibiotics.  She did amoxicillin and then doxycycine and steroids and dexamethasone and those helped some but antibiotics did not.  She did sudafed and flonase and it didn't help as much.  She had a fever at first but not since. She gets bronchitis some times. She does get a lot allergies. She has been hoarse for 3 weeks and cough did get better.   1. Bronchitis   2. Chest congestion     Outpatient Encounter Medications as of 04/10/2021  Medication Sig   dexamethasone (DECADRON) 2 MG tablet Take 4 tablets for 3 days then 3 tablets for 3 days then 2 tablets for 3 days then 1 tablet for 3 days   levofloxacin (LEVAQUIN) 500 MG tablet Take 1 tablet (500 mg total) by mouth daily for 7 days.   cetirizine (ZYRTEC) 10 MG tablet Take 1 tablet (10 mg total) by mouth daily.   fluticasone (FLONASE) 50 MCG/ACT nasal spray Place 2 sprays into both nostrils daily.   omeprazole (PRILOSEC) 40 MG capsule Take 1 capsule (40 mg total) by mouth daily.   [DISCONTINUED]  amLODipine (NORVASC) 5 MG tablet Take 1 tablet (5 mg total) by mouth daily.   No facility-administered encounter medications on file as of 04/10/2021.    Review of Systems  Constitutional:  Negative for chills and fever.  HENT:  Positive for congestion, postnasal drip, rhinorrhea, sinus pressure, sneezing and voice change. Negative for ear discharge, ear pain and sore throat.   Eyes:  Negative for pain, redness and visual disturbance.  Respiratory:  Positive for cough. Negative for chest tightness, shortness of breath and wheezing.   Cardiovascular:  Negative for chest pain and leg swelling.  Genitourinary:  Negative for difficulty urinating and dysuria.  Musculoskeletal:  Negative for back pain and gait problem.  Skin:  Negative for rash.  Neurological:  Negative for light-headedness and headaches.  Psychiatric/Behavioral:  Negative for agitation and behavioral problems.   All other systems reviewed and are negative.  Observations/Objective: Patient sounds comfortable and in no acute distress  Assessment and Plan: Problem List Items Addressed This Visit   None Visit Diagnoses     Bronchitis    -  Primary   Relevant Medications   levofloxacin (LEVAQUIN) 500 MG tablet   dexamethasone (DECADRON) 2 MG tablet   Chest congestion       Relevant Medications   levofloxacin (LEVAQUIN) 500 MG tablet   dexamethasone (DECADRON) 2 MG tablet   Other Relevant Orders   DG Chest 2 View  Instructed patient to come for x-ray after 4 PM  Will treat like possible pneumonia, she already has albuterol inhaler and will give Levaquin and dexamethasone taper. Follow up plan: Return if symptoms worsen or fail to improve.     I discussed the assessment and treatment plan with the patient. The patient was provided an opportunity to ask questions and all were answered. The patient agreed with the plan and demonstrated an understanding of the instructions.   The patient was advised to call  back or seek an in-person evaluation if the symptoms worsen or if the condition fails to improve as anticipated.  The above assessment and management plan was discussed with the patient. The patient verbalized understanding of and has agreed to the management plan. Patient is aware to call the clinic if symptoms persist or worsen. Patient is aware when to return to the clinic for a follow-up visit. Patient educated on when it is appropriate to go to the emergency department.    I provided 13 minutes of non-face-to-face time during this encounter.    Nils Pyle, MD

## 2021-04-14 ENCOUNTER — Telehealth: Payer: Self-pay | Admitting: Nurse Practitioner

## 2021-04-14 NOTE — Telephone Encounter (Signed)
Call pt with Xray result when available.

## 2021-04-28 ENCOUNTER — Other Ambulatory Visit: Payer: Self-pay

## 2021-04-28 ENCOUNTER — Encounter: Payer: Self-pay | Admitting: Nurse Practitioner

## 2021-04-28 ENCOUNTER — Ambulatory Visit (INDEPENDENT_AMBULATORY_CARE_PROVIDER_SITE_OTHER): Payer: BC Managed Care – PPO | Admitting: Nurse Practitioner

## 2021-04-28 VITALS — BP 124/81 | HR 87 | Temp 98.4°F | Resp 20 | Ht 67.0 in | Wt 287.0 lb

## 2021-04-28 DIAGNOSIS — J040 Acute laryngitis: Secondary | ICD-10-CM

## 2021-04-28 DIAGNOSIS — B370 Candidal stomatitis: Secondary | ICD-10-CM

## 2021-04-28 MED ORDER — FLUCONAZOLE 150 MG PO TABS: ORAL_TABLET | ORAL | 0 refills | Status: DC

## 2021-04-28 NOTE — Progress Notes (Signed)
   Subjective:    Patient ID: Cathy Stone, female    DOB: January 16, 1976, 45 y.o.   MRN: [id number]   Chief Complaint: Hoarse (Sinus drainage/) and tongue sore and swollen   HPI Patient has been hoarse since October. She got sick while she was in [place] and she has been hoarse eery since. She also states for the last 4 days  her tongue feels swollen and raw. She has been taking OTC allergy meds. She did on demand visit and was given nystatin. That has not helped  She has been on 3 antibiotics since first of October and steroids 3 x.  Review of Systems  Constitutional:  Positive for chills and fever.  HENT:  Positive for postnasal drip (worse at night). Negative for sore throat.   Respiratory: Negative.    Cardiovascular: Negative.   Genitourinary: Negative.   Neurological: Negative.       Objective:   Physical Exam Vitals and nursing note reviewed.  Constitutional:      Appearance: Normal appearance.  HENT:     Right Ear: Tympanic membrane normal.     Left Ear: Tympanic membrane normal.     Nose: Congestion present. No rhinorrhea.     Mouth/Throat:     Mouth: Mucous membranes are moist.     Comments: tongue is raw appearing with slight white film. Cardiovascular:     Rate and Rhythm: Normal rate and regular rhythm.     Heart sounds: Normal heart sounds.  Pulmonary:     Effort: Pulmonary effort is normal.     Breath sounds: Normal breath sounds.   BP 124/81   Pulse 87   Temp 98.4 F (36.9 C) (Temporal)   Resp 20   Ht 5\' 7"  (1.702 m)   Wt 287 lb (130.2 kg)   SpO2 100%   BMI 44.95 kg/m         Assessment & Plan:  in today with chief complaint of Hoarse (Sinus drainage/) and tongue sore and swollen   1. Laryngitis Has had to many steroids to repeat at this time Rest voice when you can - Ambulatory referral to ENT  2. Thrush Continue nystatin as prescribed - fluconazole (DIFLUCAN) 150 MG tablet; 1 po q week x 4 weeks  Dispense: 4 tablet;  Refill: 0    The above assessment and management plan was discussed with the patient. The patient verbalized understanding of and has agreed to the management plan. Patient is aware to call the clinic if symptoms persist or worsen. Patient is aware when to return to the clinic for a follow-up visit. Patient educated on when it is appropriate to go to the emergency department.   Mary-Margaret [name], FNP

## 2021-04-28 NOTE — Patient Instructions (Signed)
Laryngitis ?Laryngitis is irritation and swelling (inflammation) of your vocal cords. It causes your voice to sound hoarse and may cause you to lose your voice. ?Depending on the cause, this condition may go away after a short time or may last for more than 3 weeks. Treatment often involves resting your voice and using medicines to soothe your throat. ?What are the causes? ?Laryngitis that lasts for a short time may be caused by: ?An infection caused by a virus. ?Lots of talking, yelling, or singing. This is also called vocal strain. ?An infection caused by bacteria. ?Laryngitis that lasts for more than 3 weeks can be caused by: ?Lots of talking, yelling, or singing. ?An injury to the vocal cords. ?Acid reflux. ?Allergies. ?Sinus infection. ?Mucus draining from the nose down the throat (postnasal drip). ?Smoking. ?Drinking too much alcohol. ?Breathing in chemicals or dust. ?Having growths on the vocal cords. ?What increases the risk? ?Smoking. ?Drinking too much alcohol. ?Having allergies. ?Breathing in fumes at work. ?What are the signs or symptoms? ?A change in your voice. It may sound low and hoarse. ?Loss of voice. ?Dry cough. ?Sore throat. ?Dry throat. ?Stuffy nose. ?How is this treated? ?Treatment depends on what is causing the laryngitis. Usually, treatment includes: ?Resting your voice. ?Using medicines to soothe your throat. ?If your laryngitis is caused by an infection from bacteria, you may need to take antibiotics. ?If your laryngitis is caused by a growth on your vocal cords, you may need to have a surgery to remove it. ?Follow these instructions at home: ?Medicines ?Take over-the-counter and prescription medicines only as told by your doctor. ?If you were prescribed an antibiotic medicine, take it as told by your doctor. Do not stop taking it even if you start to feel better. ?Use throat lozenges or sprays to soothe your throat as told by your doctor. ?General instructions ? ?Talk as little as  possible. To do this: ?Avoid whispering. ?Write instead of talking. Do this until your voice is back to normal. ?Rinse your mouth (gargle) with a salt water mixture 3-4 times a day or as needed. ?To make salt water, dissolve ?-1 tsp (3-6 g) of salt in 1 cup (237 mL) of warm water. ?Do not swallow this mixture. ?Drink enough fluid to keep your pee (urine) pale yellow. ?Breathe in moist air. Use a humidifier if you live in a dry climate. ?Do not smoke or use any products that contain nicotine or tobacco. If you need help quitting, ask your doctor. ?Contact a doctor if: ?You have a fever. ?Your pain is worse. ?Your symptoms do not get better in 2 weeks. ?Get help right away if: ?You cough up blood. ?You have trouble swallowing. ?You have trouble breathing. ?Summary ?Laryngitis is inflammation of your vocal cords. ?This condition causes your voice to sound low and hoarse. ?Rest your voice by talking as little as possible. Also avoid whispering. ?Get help right away if you have trouble swallowing or breathing or if you cough up blood. ?This information is not intended to replace advice given to you by your health care provider. Make sure you discuss any questions you have with your health care provider. ?Document Revised: 08/19/2020 Document Reviewed: 08/19/2020 ?Elsevier Patient Education ? 2022 [employer] Inc[employer] ? ?

## 2021-04-29 DIAGNOSIS — J382 Nodules of vocal cords: Secondary | ICD-10-CM | POA: Diagnosis not present

## 2021-12-03 DIAGNOSIS — Z0000 Encounter for general adult medical examination without abnormal findings: Secondary | ICD-10-CM | POA: Diagnosis not present

## 2021-12-03 LAB — HM PAP SMEAR: HM Pap smear: NEGATIVE

## 2021-12-23 ENCOUNTER — Ambulatory Visit: Payer: Medicaid Other | Admitting: Nurse Practitioner

## 2021-12-23 ENCOUNTER — Encounter: Payer: Self-pay | Admitting: Nurse Practitioner

## 2021-12-23 VITALS — BP 148/103 | HR 90 | Temp 98.4°F | Resp 20 | Ht 67.0 in | Wt 273.0 lb

## 2021-12-23 DIAGNOSIS — E785 Hyperlipidemia, unspecified: Secondary | ICD-10-CM

## 2021-12-23 DIAGNOSIS — E1169 Type 2 diabetes mellitus with other specified complication: Secondary | ICD-10-CM | POA: Diagnosis not present

## 2021-12-23 MED ORDER — BLOOD GLUCOSE METER KIT: PACK | 0 refills | Status: AC

## 2021-12-23 MED ORDER — SEMAGLUTIDE(0.25 OR 0.5MG/DOS) 2 MG/3ML ~~LOC~~ SOPN: 0.5000 mg | PEN_INJECTOR | SUBCUTANEOUS | 1 refills | Status: DC

## 2021-12-23 NOTE — Progress Notes (Signed)
Subjective:    Patient ID: Cathy Stone, female    DOB: [birth year], 46 y.o.   MRN: [id number]   Chief Complaint: Diabetes (Newly diagnosed. Wants to see endocrinology/)   Diabetes Pertinent negatives for hypoglycemia include no dizziness or headaches. Pertinent negatives for diabetes include no chest pain, no polydipsia and no weakness.   Patient went to see her GYN for pap and physical. They did blood work and her blood sugar was elevated. They added HGBA1c and it was 7.2%. they told her she needs to see her PCP for treatment. She wants to see endocrinology. Does not want to take metformin.     Review of Systems  Constitutional:  Negative for diaphoresis.  Eyes:  Negative for pain.  Respiratory:  Negative for shortness of breath.   Cardiovascular:  Negative for chest pain, palpitations and leg swelling.  Gastrointestinal:  Negative for abdominal pain.  Endocrine: Negative for polydipsia.  Skin:  Negative for rash.  Neurological:  Negative for dizziness, weakness and headaches.  Hematological:  Does not bruise/bleed easily.  All other systems reviewed and are negative.      Objective:   Physical Exam Vitals and nursing note reviewed.  Constitutional:      General: She is not in acute distress.    Appearance: Normal appearance. She is well-developed.  Neck:     Vascular: No carotid bruit or JVD.  Cardiovascular:     Rate and Rhythm: Normal rate and regular rhythm.     Heart sounds: Normal heart sounds.  Pulmonary:     Effort: Pulmonary effort is normal. No respiratory distress.     Breath sounds: Normal breath sounds. No wheezing or rales.  Chest:     Chest wall: No tenderness.  Abdominal:     General: Bowel sounds are normal. There is no distension or abdominal bruit.     Palpations: Abdomen is soft. There is no hepatomegaly, splenomegaly, mass or pulsatile mass.     Tenderness: There is no abdominal tenderness.  Musculoskeletal:        General: Normal range  of motion.     Cervical back: Normal range of motion and neck supple.  Lymphadenopathy:     Cervical: No cervical adenopathy.  Skin:    General: Skin is warm and dry.  Neurological:     Mental Status: She is alert and oriented to person, place, and time.     Deep Tendon Reflexes: Reflexes are normal and symmetric.  Psychiatric:        Behavior: Behavior normal.        Thought Content: Thought content normal.        Judgment: Judgment normal.   BP (!) 148/103   Pulse 90   Temp 98.4 F (36.9 C) (Temporal)   Resp 20   Ht 5\' 7"  (1.702 m)   Wt 273 lb (123.8 kg)   SpO2 97%   BMI 42.76 kg/m       Assessment & Plan:   in today with chief complaint of Diabetes (Newly diagnosed. Wants to see endocrinology/)   1. Hyperlipidemia associated with type 2 diabetes mellitus (HCC) Demonstrated ozempic injection in office Handout on side effects RTO  in 5 weeks recheck - Semaglutide,0.25 or 0.5MG /DOS, 2 MG/3ML SOPN; Inject 0.5 mg into the skin once a week.  Dispense: 3 mL; Refill: 1    The above assessment and management plan was discussed with the patient. The patient verbalized understanding of and has agreed to  the management plan. Patient is aware to call the clinic if symptoms persist or worsen. Patient is aware when to return to the clinic for a follow-up visit. Patient educated on when it is appropriate to go to the emergency department.   Mary-Margaret Daphine Deutscher, FNP

## 2021-12-23 NOTE — Patient Instructions (Signed)

## 2021-12-25 ENCOUNTER — Telehealth: Payer: Self-pay

## 2021-12-25 NOTE — Telephone Encounter (Signed)
Ozempic Approved 12/25/21 through 12/25/22  Pt is aware and will call pharmacy to have filled.

## 2021-12-25 NOTE — Telephone Encounter (Signed)
(  Key: [id number]) Rx #: [id number] Ozempic (0.25 or 0.5 MG/DOSE) 2MG /3ML pen-injectors   Form [facility] [employer] Form [phone] NCPDP) Created 15 hours ago Sent to Plan 1 minute ago Plan Response 1 minute ago Submit Clinical Questions less than a minute ago Determination Wait for Determination Please wait for ([phone] to return a determination.

## 2022-02-09 ENCOUNTER — Other Ambulatory Visit: Payer: Self-pay | Admitting: Nurse Practitioner

## 2022-02-09 DIAGNOSIS — E1169 Type 2 diabetes mellitus with other specified complication: Secondary | ICD-10-CM

## 2022-02-20 DIAGNOSIS — M25561 Pain in right knee: Secondary | ICD-10-CM | POA: Diagnosis not present

## 2022-02-20 DIAGNOSIS — Z9889 Other specified postprocedural states: Secondary | ICD-10-CM | POA: Diagnosis not present

## 2022-02-23 ENCOUNTER — Encounter: Payer: Self-pay | Admitting: Nurse Practitioner

## 2022-02-23 ENCOUNTER — Ambulatory Visit: Payer: Medicaid Other | Admitting: Nurse Practitioner

## 2022-02-23 VITALS — BP 121/73 | HR 71 | Temp 98.4°F | Resp 20 | Ht 67.0 in | Wt 258.0 lb

## 2022-02-23 DIAGNOSIS — K219 Gastro-esophageal reflux disease without esophagitis: Secondary | ICD-10-CM | POA: Diagnosis not present

## 2022-02-23 DIAGNOSIS — Z6839 Body mass index (BMI) 39.0-39.9, adult: Secondary | ICD-10-CM

## 2022-02-23 DIAGNOSIS — E1169 Type 2 diabetes mellitus with other specified complication: Secondary | ICD-10-CM

## 2022-02-23 DIAGNOSIS — E119 Type 2 diabetes mellitus without complications: Secondary | ICD-10-CM | POA: Diagnosis not present

## 2022-02-23 DIAGNOSIS — E785 Hyperlipidemia, unspecified: Secondary | ICD-10-CM | POA: Diagnosis not present

## 2022-02-23 DIAGNOSIS — R739 Hyperglycemia, unspecified: Secondary | ICD-10-CM | POA: Diagnosis not present

## 2022-02-23 LAB — BAYER DCA HB A1C WAIVED: HB A1C (BAYER DCA - WAIVED): 6 % — ABNORMAL HIGH (ref 4.8–5.6)

## 2022-02-23 MED ORDER — SEMAGLUTIDE (1 MG/DOSE) 4 MG/3ML ~~LOC~~ SOPN: 1.0000 mg | PEN_INJECTOR | SUBCUTANEOUS | 3 refills | Status: DC

## 2022-02-23 MED ORDER — ESOMEPRAZOLE MAGNESIUM 20 MG PO CPDR: 20.0000 mg | DELAYED_RELEASE_CAPSULE | Freq: Every day | ORAL | 1 refills | Status: DC

## 2022-02-23 NOTE — Patient Instructions (Signed)

## 2022-02-23 NOTE — Progress Notes (Signed)
Subjective:    Patient ID: Cathy Stone, female    DOB: 23-May-1976, 46 y.o.   MRN: [id number]   Chief Complaint: Medical Management of Chronic Issues    HPI:  Cathy Stone is a 46 y.o. who identifies as a female who was assigned female at birth.   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 Hyperlipidemia associated with type 2 diabetes mellitus ([place]) Has been watching diet but does no exercise. Lab Results  Component Value Date   CHOL 159 03/13/2015   HDL 40 03/13/2015   LDLCALC 105 (H) 03/13/2015   TRIG 71 03/13/2015   CHOLHDL 4.0 03/13/2015     2. Diabetes mellitus without complication ([place]) 3. Hyperglycemia Is on metformin and is doing well. Ozempic was added at last visit. She has not been able to check blood sugars at home.  4. Gastroesophageal reflux disease, unspecified whether esophagitis present Is on nexium and is doing well.  5. BMI 39.0-39.9,adult Weight is down 15lbs Wt Readings from Last 3 Encounters:  02/23/22 258 lb (117 kg)  12/23/21 273 lb (123.8 kg)  04/28/21 287 lb (130.2 kg)   BMI Readings from Last 3 Encounters:  02/23/22 40.41 kg/m  12/23/21 42.76 kg/m  04/28/21 44.95 kg/m      New complaints: None today  Allergies  Allergen Reactions   Darvocet [Propoxyphene N-Acetaminophen] Rash   Outpatient Encounter Medications as of 02/23/2022  Medication Sig   blood glucose meter kit and supplies Dispense based on patient and insurance preference. Use up to four times daily as directed. (FOR ICD-10 E10.9, E11.9).   cetirizine (ZYRTEC) 10 MG tablet Take 1 tablet (10 mg total) by mouth daily.   esomeprazole (NEXIUM) 20 MG capsule Take 20 mg by mouth daily at 12 noon.   fluticasone (FLONASE) 50 MCG/ACT nasal spray Place 2 sprays into both nostrils daily.   Semaglutide,0.25 or 0.5MG/DOS, (OZEMPIC, 0.25 OR 0.5 MG/DOSE,) 2 MG/3ML  SOPN Inject 0.5 mg into the skin once a week.   metFORMIN (GLUCOPHAGE) 500 MG tablet Take by mouth. (Patient not taking: Reported on 02/23/2022)   No facility-administered encounter medications on file as of 02/23/2022.    Past Surgical History:  Procedure Laterality Date   CESAREAN SECTION     DENTAL SURGERY      Family History  Problem Relation Age of Onset   Asthma Mother    COPD Mother    Cancer Father    Diabetes Other       Controlled substance contract: n/a     Review of Systems  Constitutional:  Negative for diaphoresis.  Eyes:  Negative for pain.  Respiratory:  Negative for shortness of breath.   Cardiovascular:  Negative for chest pain, palpitations and leg swelling.  Gastrointestinal:  Negative for abdominal pain.  Endocrine: Negative for polydipsia.  Skin:  Negative for rash.  Neurological:  Negative for dizziness, weakness and headaches.  Hematological:  Does not bruise/bleed easily.  All other systems reviewed and are negative.      Objective:   Physical Exam Vitals and nursing note reviewed.  Constitutional:      General: She is not in acute distress.    Appearance: Normal appearance. She is well-developed.  HENT:     Head: Normocephalic.     Right Ear: Tympanic membrane normal.     Left Ear: Tympanic membrane normal.  Nose: Nose normal.     Mouth/Throat:     Mouth: Mucous membranes are moist.  Eyes:     Pupils: Pupils are equal, round, and reactive to light.  Neck:     Vascular: No carotid bruit or JVD.  Cardiovascular:     Rate and Rhythm: Normal rate and regular rhythm.     Heart sounds: Normal heart sounds.  Pulmonary:     Effort: Pulmonary effort is normal. No respiratory distress.     Breath sounds: Normal breath sounds. No wheezing or rales.  Chest:     Chest wall: No tenderness.  Abdominal:     General: Bowel sounds are normal. There is no distension or abdominal bruit.     Palpations: Abdomen is soft. There is no  hepatomegaly, splenomegaly, mass or pulsatile mass.     Tenderness: There is no abdominal tenderness.  Musculoskeletal:        General: Normal range of motion.     Cervical back: Normal range of motion and neck supple.  Lymphadenopathy:     Cervical: No cervical adenopathy.  Skin:    General: Skin is warm and dry.  Neurological:     Mental Status: She is alert and oriented to person, place, and time.     Deep Tendon Reflexes: Reflexes are normal and symmetric.  Psychiatric:        Behavior: Behavior normal.        Thought Content: Thought content normal.        Judgment: Judgment normal.    BP 121/73   Pulse 71   Temp 98.4 F (36.9 C) (Temporal)   Resp 20   Ht '5\' 7"'  (1.702 m)   Wt 258 lb (117 kg)   SpO2 100%   BMI 40.41 kg/m    Hgba1c 6.0%     Assessment & Plan:   TAHRA HITZEMAN comes in today with chief complaint of Medical Management of Chronic Issues   Diagnosis and orders addressed:  1. Hyperlipidemia associated with type 2 diabetes mellitus ([facility]) Low fat diet - Lipid panel  2. Diabetes mellitus without complication ([place]) Continue  to wathc carbs in diet'return glucometer to pharmacy and see if they will repleace it - Bayer DCA Hb A1c Waived - CBC with Differential/Platelet - CMP14+EGFR - Semaglutide, 1 MG/DOSE, 4 MG/3ML SOPN; Inject 1 mg as directed once a week.  Dispense: 3 mL; Refill: 3  3. Hyperglycemia  4. Gastroesophageal reflux disease, unspecified whether esophagitis present Avoid spicy foods Do not eat 2 hours prior to bedtime  - esomeprazole (NEXIUM) 20 MG capsule; Take 1 capsule (20 mg total) by mouth daily at 12 noon.  Dispense: 90 capsule; Refill: 1  5. BMI 39.0-39.9,adult Discussed diet and exercise for person with BMI >25 Will recheck weight in 3-6 months    Labs pending Health Maintenance reviewed Diet and exercise encouraged  Follow up plan: 3 months   Mary-Margaret Hassell Done, FNP

## 2022-02-24 LAB — CBC WITH DIFFERENTIAL/PLATELET
Basophils Absolute: 0.1 10*3/uL (ref 0.0–0.2)
Basos: 1 %
EOS (ABSOLUTE): 0.1 10*3/uL (ref 0.0–0.4)
Eos: 1 %
Hematocrit: 41.5 % (ref 34.0–46.6)
Hemoglobin: 13.1 g/dL (ref 11.1–15.9)
Immature Grans (Abs): 0 10*3/uL (ref 0.0–0.1)
Immature Granulocytes: 0 %
Lymphocytes Absolute: 2.7 10*3/uL (ref 0.7–3.1)
Lymphs: 18 %
MCH: 26.3 pg — ABNORMAL LOW (ref 26.6–33.0)
MCHC: 31.6 g/dL (ref 31.5–35.7)
MCV: 83 fL (ref 79–97)
Monocytes Absolute: 1.1 10*3/uL — ABNORMAL HIGH (ref 0.1–0.9)
Monocytes: 7 %
Neutrophils Absolute: 10.9 10*3/uL — ABNORMAL HIGH (ref 1.4–7.0)
Neutrophils: 73 %
Platelets: 479 10*3/uL — ABNORMAL HIGH (ref 150–450)
RBC: 4.99 x10E6/uL (ref 3.77–5.28)
RDW: 13.7 % (ref 11.7–15.4)
WBC: 14.9 10*3/uL — ABNORMAL HIGH (ref 3.4–10.8)

## 2022-02-24 LAB — MICROALBUMIN / CREATININE URINE RATIO
Creatinine, Urine: 80.2 mg/dL
Microalb/Creat Ratio: <4 mg/g creat (ref 0–29)
Microalbumin, Urine: <3 ug/mL

## 2022-02-24 LAB — CMP14+EGFR
ALT: 23 IU/L (ref 0–32)
AST: 19 IU/L (ref 0–40)
Albumin/Globulin Ratio: 1.2 (ref 1.2–2.2)
Albumin: 4 g/dL (ref 3.9–4.9)
Alkaline Phosphatase: 102 IU/L (ref 44–121)
BUN/Creatinine Ratio: 10 (ref 9–23)
BUN: 8 mg/dL (ref 6–24)
Bilirubin Total: 0.3 mg/dL (ref 0.0–1.2)
CO2: 20 mmol/L (ref 20–29)
Calcium: 9.1 mg/dL (ref 8.7–10.2)
Chloride: 104 mmol/L (ref 96–106)
Creatinine, Ser: 0.8 mg/dL (ref 0.57–1.00)
Globulin, Total: 3.4 g/dL (ref 1.5–4.5)
Glucose: 130 mg/dL — ABNORMAL HIGH (ref 70–99)
Potassium: 4.6 mmol/L (ref 3.5–5.2)
Sodium: 139 mmol/L (ref 134–144)
Total Protein: 7.4 g/dL (ref 6.0–8.5)
eGFR: 94 mL/min/{1.73_m2} (ref >59)

## 2022-02-24 LAB — LIPID PANEL
Chol/HDL Ratio: 3.8 ratio (ref 0.0–4.4)
Cholesterol, Total: 171 mg/dL (ref 100–199)
HDL: 45 mg/dL (ref >39)
LDL Chol Calc (NIH): 111 mg/dL — ABNORMAL HIGH (ref 0–99)
Triglycerides: 78 mg/dL (ref 0–149)
VLDL Cholesterol Cal: 15 mg/dL (ref 5–40)

## 2022-03-01 DIAGNOSIS — M25561 Pain in right knee: Secondary | ICD-10-CM | POA: Diagnosis not present

## 2022-03-02 ENCOUNTER — Telehealth: Payer: Medicaid Other | Admitting: Family Medicine

## 2022-03-02 ENCOUNTER — Encounter: Payer: Self-pay | Admitting: Family Medicine

## 2022-03-02 DIAGNOSIS — J40 Bronchitis, not specified as acute or chronic: Secondary | ICD-10-CM

## 2022-03-02 MED ORDER — AZITHROMYCIN 250 MG PO TABS: ORAL_TABLET | ORAL | 0 refills | Status: DC

## 2022-03-02 NOTE — Progress Notes (Signed)
   Virtual Visit via video Note   Due to COVID-19 pandemic this visit was conducted virtually. This visit type was conducted due to national recommendations for restrictions regarding the COVID-19 Pandemic (e.g. social distancing, sheltering in place) in an effort to limit this patient's exposure and mitigate transmission in our community. All issues noted in this document were discussed and addressed.  A physical exam was not performed with this format.  I connected with  Ara Kussmaul  on 03/02/22 at 1355 by video and verified that I am speaking with the correct person using two identifiers. KEYONI LAPINSKI is currently located at work and no one is currently with her during the visit. The provider, Gwenlyn Perking, FNP is located in their office at time of visit.  I discussed the limitations, risks, security and privacy concerns of performing an evaluation and management service by video  and the availability of in person appointments. I also discussed with the patient that there may be a patient responsible charge related to this service. The patient expressed understanding and agreed to proceed.  CC: Cough  History and Present Illness: Navina reports a chronic cough due to her allergies. Her cough has been worse and has been productive of yellow sputum for the last few days. She also reports pressure around her cheeks. She denies fever, sore throat, shortness of breath, chest pain, nausea, vomiting, or diarrhea. She does sound wheezy when coughing. She does have a history of bronchitis. She has had a negative Covid test. She has been taking flonase, zyrtec, and alka seltzer cold and flu with a little improvement.    ROS As per HPI.   Observations/Objective: Alert and oriented x 3. Able to speak in full sentences without difficulty. Coughing noted. Respirations appear unlabored. Mood and behavior are normal.   Assessment and Plan: Diagnoses and all orders for this  visit:  Bronchitis Zpak as below. Continue expectorant and decongestant. Return to office for new or worsening symptoms, or if symptoms persist.  -     azithromycin (ZITHROMAX Z-PAK) 250 MG tablet; As directed     Follow Up Instructions: As needed.     I discussed the assessment and treatment plan with the patient. The patient was provided an opportunity to ask questions and all were answered. The patient agreed with the plan and demonstrated an understanding of the instructions.   The patient was advised to call back or seek an in-person evaluation if the symptoms worsen or if the condition fails to improve as anticipated.  The above assessment and management plan was discussed with the patient. The patient verbalized understanding of and has agreed to the management plan. Patient is aware to call the clinic if symptoms persist or worsen. Patient is aware when to return to the clinic for a follow-up visit. Patient educated on when it is appropriate to go to the emergency department.   Time call ended: 1403  I provided 8 minutes of face-to-face time during this encounter.    Gwenlyn Perking, FNP

## 2022-03-13 DIAGNOSIS — S83281A Other tear of lateral meniscus, current injury, right knee, initial encounter: Secondary | ICD-10-CM | POA: Diagnosis not present

## 2022-03-13 DIAGNOSIS — M2241 Chondromalacia patellae, right knee: Secondary | ICD-10-CM | POA: Diagnosis not present

## 2022-04-14 ENCOUNTER — Telehealth: Payer: Medicaid Other | Admitting: Nurse Practitioner

## 2022-04-14 ENCOUNTER — Encounter: Payer: Self-pay | Admitting: Nurse Practitioner

## 2022-04-14 DIAGNOSIS — J069 Acute upper respiratory infection, unspecified: Secondary | ICD-10-CM | POA: Diagnosis not present

## 2022-04-14 MED ORDER — PROMETHAZINE-DM 6.25-15 MG/5ML PO SYRP: 5.0000 mL | ORAL_SOLUTION | Freq: Four times a day (QID) | ORAL | 0 refills | Status: DC | PRN

## 2022-04-14 MED ORDER — AMOXICILLIN-POT CLAVULANATE 875-125 MG PO TABS: 1.0000 | ORAL_TABLET | Freq: Two times a day (BID) | ORAL | 0 refills | Status: DC

## 2022-04-14 NOTE — Progress Notes (Signed)
Virtual Visit Consent   Cathy Stone, you are scheduled for a virtual visit with Mary-Margaret Hassell Done, [place], a [place] provider, today.     Just as with appointments in the office, your consent must be obtained to participate.  Your consent will be active for this visit and any virtual visit you may have with one of our providers in the next 365 days.     If you have a MyChart account, a copy of this consent can be sent to you electronically.  All virtual visits are billed to your insurance company just like a traditional visit in the office.    As this is a virtual visit, video technology does not allow for your provider to perform a traditional examination.  This may limit your provider's ability to fully assess your condition.  If your provider identifies any concerns that need to be evaluated in person or the need to arrange testing (such as labs, EKG, etc.), we will make arrangements to do so.     Although advances in technology are sophisticated, we cannot ensure that it will always work on either your end or our end.  If the connection with a video visit is poor, the visit may have to be switched to a telephone visit.  With either a video or telephone visit, we are not always able to ensure that we have a secure connection.     I need to obtain your verbal consent now.   Are you willing to proceed with your visit today? YES   Cathy Stone has provided verbal consent on 04/14/2022 for a virtual visit (video or telephone).   Mary-Margaret Hassell Done, FNP   Date: 04/14/2022 11:31 AM   Virtual Visit via Video Note   I, Mary-Margaret Hassell Done, connected with Cathy Stone ([id number], 10-29-1975) on 04/14/22 at  5:15 PM EDT by a video-enabled telemedicine application and verified that I am speaking with the correct person using two identifiers.  Location: Patient: Virtual Visit Location Patient: Home Provider: Virtual Visit Location Provider: Mobile   I discussed the  limitations of evaluation and management by telemedicine and the availability of in person appointments. The patient expressed understanding and agreed to proceed.    History of Present Illness: Cathy Stone is a 46 y.o. who identifies as a female who was assigned female at birth, and is being seen today for uri .  HPI: URI  This is a new problem. The current episode started in the past 7 days. The problem has been gradually worsening. There has been no fever. Associated symptoms include congestion, coughing, ear pain, headaches, rhinorrhea, sinus pain and a sore throat. She has tried acetaminophen and antihistamine for the symptoms. The treatment provided mild relief.    Review of Systems  HENT:  Positive for congestion, ear pain, rhinorrhea, sinus pain and sore throat.   Respiratory:  Positive for cough.   Neurological:  Positive for headaches.    Problems:  Patient Active Problem List   Diagnosis Date Noted   Diabetes mellitus without complication ([place]) 32/67/1245   Hyperlipidemia associated with type 2 diabetes mellitus ([place]) 02/23/2022   BMI 39.0-39.9,adult 03/13/2015   Rhinitis, allergic 03/13/2015   Esophageal reflux 03/13/2015   Hyperglycemia 80/99/8338   Helicobacter positive gastritis 08/03/2014    Allergies:  Allergies  Allergen Reactions   Darvocet [Propoxyphene N-Acetaminophen] Rash   Medications:  Current Outpatient Medications:    azithromycin (ZITHROMAX Z-PAK) 250 MG tablet, As directed, Disp: 6 tablet, Rfl:  0   blood glucose meter kit and supplies, Dispense based on patient and insurance preference. Use up to four times daily as directed. (FOR ICD-10 E10.9, E11.9)., Disp: 1 each, Rfl: 0   cetirizine (ZYRTEC) 10 MG tablet, Take 1 tablet (10 mg total) by mouth daily., Disp: 90 tablet, Rfl: 0   esomeprazole (NEXIUM) 20 MG capsule, Take 1 capsule (20 mg total) by mouth daily at 12 noon., Disp: 90 capsule, Rfl: 1   fluticasone (FLONASE) 50 MCG/ACT nasal spray,  Place 2 sprays into both nostrils daily., Disp: 16 g, Rfl: 6   Semaglutide, 1 MG/DOSE, 4 MG/3ML SOPN, Inject 1 mg as directed once a week., Disp: 3 mL, Rfl: 3  Observations/Objective: Patient is well-developed, well-nourished in no acute distress.  Resting comfortably  at home.  Head is normocephalic, atraumatic.  No labored breathing.  Speech is clear and coherent with logical content.  Patient is alert and oriented at baseline.  Raspy voice Dry cough  Assessment and Plan:  Ara Kussmaul in today with chief complaint of URI   1. URI with cough and congestion 1. Take meds as prescribed 2. Use a cool mist humidifier especially during the winter months and when heat has been humid. 3. Use saline nose sprays frequently 4. Saline irrigations of the nose can be very helpful if done frequently.  * 4X daily for 1 week*  * Use of a nettie pot can be helpful with this. Follow directions with this* 5. Drink plenty of fluids 6. Keep thermostat turn down low 7.For any cough or congestion- promethazine DM 8. For fever or aces or pains- take tylenol or ibuprofen appropriate for age and weight.  * for fevers greater than 101 orally you may alternate ibuprofen and tylenol every  3 hours.    Meds ordered this encounter  Medications   amoxicillin-clavulanate (AUGMENTIN) 875-125 MG tablet    Sig: Take 1 tablet by mouth 2 (two) times daily.    Dispense:  14 tablet    Refill:  0    Order Specific Question:   Supervising Provider    Answer:   Caryl Pina A [[id number]]   promethazine-dextromethorphan (PROMETHAZINE-DM) 6.25-15 MG/5ML syrup    Sig: Take 5 mLs by mouth 4 (four) times daily as needed for cough.    Dispense:  118 mL    Refill:  0    Order Specific Question:   Supervising Provider    Answer:   Caryl Pina A [[id number]]     Follow Up Instructions: I discussed the assessment and treatment plan with the patient. The patient was provided an opportunity to ask questions  and all were answered. The patient agreed with the plan and demonstrated an understanding of the instructions.  A copy of instructions were sent to the patient via MyChart.  The patient was advised to call back or seek an in-person evaluation if the symptoms worsen or if the condition fails to improve as anticipated.  Time:  I spent 6 minutes with the patient via telehealth technology discussing the above problems/concerns.    Mary-Margaret Hassell Done, FNP

## 2022-04-14 NOTE — Patient Instructions (Signed)
1. Take meds as prescribed 2. Use a cool mist humidifier especially during the winter months and when heat has been humid. 3. Use saline nose sprays frequently 4. Saline irrigations of the nose can be very helpful if done frequently.  * 4X daily for 1 week*  * Use of a nettie pot can be helpful with this. Follow directions with this* 5. Drink plenty of fluids 6. Keep thermostat turn down low 7.For any cough or congestion- promethazine dm 8. For fever or aces or pains- take tylenol or ibuprofen appropriate for age and weight.  * for fevers greater than 101 orally you may alternate ibuprofen and tylenol every  3 hours.    

## 2022-05-20 ENCOUNTER — Other Ambulatory Visit (HOSPITAL_COMMUNITY): Payer: Self-pay

## 2022-05-25 ENCOUNTER — Encounter: Payer: Self-pay | Admitting: Nurse Practitioner

## 2022-05-25 ENCOUNTER — Ambulatory Visit: Payer: Medicaid Other | Admitting: Nurse Practitioner

## 2022-06-12 ENCOUNTER — Other Ambulatory Visit (HOSPITAL_COMMUNITY): Payer: Self-pay

## 2022-06-18 ENCOUNTER — Encounter: Payer: Self-pay | Admitting: Nurse Practitioner

## 2022-06-18 ENCOUNTER — Ambulatory Visit (INDEPENDENT_AMBULATORY_CARE_PROVIDER_SITE_OTHER): Payer: BC Managed Care – PPO | Admitting: Nurse Practitioner

## 2022-06-18 VITALS — BP 120/81 | HR 88 | Temp 98.2°F | Resp 20 | Ht 67.0 in | Wt 246.0 lb

## 2022-06-18 DIAGNOSIS — Z23 Encounter for immunization: Secondary | ICD-10-CM

## 2022-06-18 DIAGNOSIS — R0981 Nasal congestion: Secondary | ICD-10-CM

## 2022-06-18 DIAGNOSIS — S161XXA Strain of muscle, fascia and tendon at neck level, initial encounter: Secondary | ICD-10-CM | POA: Diagnosis not present

## 2022-06-18 MED ORDER — PREDNISONE 20 MG PO TABS: 40.0000 mg | ORAL_TABLET | Freq: Every day | ORAL | 0 refills | Status: AC

## 2022-06-18 NOTE — Progress Notes (Signed)
Subjective:    Patient ID: Cathy Stone, female    DOB: [birth year], 47 y.o.   MRN: [id number]   Chief Complaint: Swelling to right side of neck (Gets worse when she uses her right arm/) and Nasal Congestion (Had Covid recently )   HPI Patient come sin today with 2 complaints: - nasal congestion- Had covid in October and has had congestion since then. Always feels like she has congestion in her throat. - right neck swelling. Swells when she uses her right arm. Denies pain. Noticed about 2 weeks ago. She has not taken anyrhing for this.     Review of Systems  Constitutional:  Negative for diaphoresis.  Eyes:  Negative for pain.  Respiratory:  Negative for shortness of breath.   Cardiovascular:  Negative for chest pain, palpitations and leg swelling.  Gastrointestinal:  Negative for abdominal pain.  Endocrine: Negative for polydipsia.  Skin:  Negative for rash.  Neurological:  Negative for dizziness, weakness and headaches.  Hematological:  Does not bruise/bleed easily.  All other systems reviewed and are negative.      Objective:   Physical Exam Vitals and nursing note reviewed.  Constitutional:      General: She is not in acute distress.    Appearance: Normal appearance. She is well-developed.  HENT:     Head: Normocephalic.     Right Ear: Tympanic membrane normal.     Left Ear: Tympanic membrane normal.     Nose: Nose normal.     Mouth/Throat:     Mouth: Mucous membranes are moist.  Eyes:     Pupils: Pupils are equal, round, and reactive to light.  Neck:     Vascular: No carotid bruit or JVD.  Cardiovascular:     Rate and Rhythm: Normal rate and regular rhythm.     Heart sounds: Normal heart sounds.  Pulmonary:     Effort: Pulmonary effort is normal. No respiratory distress.     Breath sounds: Normal breath sounds. No wheezing or rales.  Chest:     Chest wall: No tenderness.  Abdominal:     General: Bowel sounds are normal. There is no distension or  abdominal bruit.     Palpations: Abdomen is soft. There is no hepatomegaly, splenomegaly, mass or pulsatile mass.     Tenderness: There is no abdominal tenderness.  Musculoskeletal:        General: Normal range of motion.     Cervical back: Normal range of motion and neck supple.     Comments: FROM of neck with tightness on tilting to the left No point tenderness Grips equal bil   Lymphadenopathy:     Cervical: No cervical adenopathy.  Skin:    General: Skin is warm and dry.  Neurological:     Mental Status: She is alert and oriented to person, place, and time.     Deep Tendon Reflexes: Reflexes are normal and symmetric.  Psychiatric:        Behavior: Behavior normal.        Thought Content: Thought content normal.        Judgment: Judgment normal.    BP 120/81   Pulse 88   Temp 98.2 F (36.8 C) (Temporal)   Resp 20   Ht 5\' 7"  (1.702 m)   Wt 246 lb (111.6 kg)   SpO2 98%   BMI 38.53 kg/m         Assessment & Plan:   Ara Kussmaul in today with  chief complaint of Swelling to right side of neck (Gets worse when she uses her right arm/) and Nasal Congestion (Had Covid recently )   1. Nasal congestion Add claritin at night to flonase already taking  2. Strain of neck muscle, initial encounter Moist heat Stretches RTO prn - predniSONE (DELTASONE) 20 MG tablet; Take 2 tablets (40 mg total) by mouth daily with breakfast for 5 days. 2 po daily for 5 days  Dispense: 10 tablet; Refill: 0    The above assessment and management plan was discussed with the patient. The patient verbalized understanding of and has agreed to the management plan. Patient is aware to call the clinic if symptoms persist or worsen. Patient is aware when to return to the clinic for a follow-up visit. Patient educated on when it is appropriate to go to the emergency department.   Mary-Margaret Hassell Done, FNP

## 2022-06-18 NOTE — Addendum Note (Signed)
Addended by: Chevis Pretty on: 06/18/2022 10:55 AM   Modules accepted: Level of Service

## 2022-06-18 NOTE — Patient Instructions (Signed)
Cervical Strain and Sprain Rehab Ask your health care provider which exercises are safe for you. Do exercises exactly as told by your health care provider and adjust them as directed. It is normal to feel mild stretching, pulling, tightness, or discomfort as you do these exercises. Stop right away if you feel sudden pain or your pain gets worse. Do not begin these exercises until told by your health care provider. Stretching and range-of-motion exercises Cervical side bending  Using good posture, sit on a stable chair or stand up. Without moving your shoulders, slowly tilt your left / right ear to your shoulder until you feel a stretch in the neck muscles on the opposite side. You should be looking straight ahead. Hold for __________ seconds. Repeat with the other side of your neck. Repeat __________ times. Complete this exercise __________ times a day. Cervical rotation  Using good posture, sit on a stable chair or stand up. Slowly turn your head to the side as if you are looking over your left / right shoulder. Keep your eyes level with the ground. Stop when you feel a stretch along the side and the back of your neck. Hold for __________ seconds. Repeat this by turning to your other side. Repeat __________ times. Complete this exercise __________ times a day. Thoracic extension and pectoral stretch  Roll a towel or a small blanket so it is about 4 inches (10 cm) in diameter. Lie down on your back on a firm surface. Put the towel in the middle of your back across your spine. It should not be under your shoulder blades. Put your hands behind your head and let your elbows fall out to your sides. Hold for __________ seconds. Repeat __________ times. Complete this exercise __________ times a day. Strengthening exercises Upper cervical flexion  Lie on your back with a thin pillow behind your head or a small, rolled-up towel under your neck. Gently tuck your chin toward your chest and nod  your head down to look toward your feet. Do not lift your head off the pillow. Hold for __________ seconds. Release the tension slowly. Relax your neck muscles completely before you repeat this exercise. Repeat __________ times. Complete this exercise __________ times a day. Cervical extension  Stand about 6 inches (15 cm) away from a wall, with your back facing the wall. Place a soft object, about 6-8 inches (15-20 cm) in diameter, between the back of your head and the wall. A soft object could be a small pillow, a ball, or a folded towel. Gently tilt your head back and press into the soft object. Keep your jaw and forehead relaxed. Hold for __________ seconds. Release the tension slowly. Relax your neck muscles completely before you repeat this exercise. Repeat __________ times. Complete this exercise __________ times a day. Posture and body mechanics Body mechanics refer to the movements and positions of your body while you do your daily activities. Posture is part of body mechanics. Good posture and healthy body mechanics can help to relieve stress in your body's tissues and joints. Good posture means that your spine is in its natural S-curve position (your spine is neutral), your shoulders are pulled back slightly, and your head is not tipped forward. The following are general guidelines for using improved posture and body mechanics in your everyday activities. Sitting  When sitting, keep your spine neutral and keep your feet flat on the floor. Use a footrest, if needed, and keep your thighs parallel to the floor. Avoid rounding   your shoulders. Avoid tilting your head forward. When working at a desk or a computer, keep your desk at a height where your hands are slightly lower than your elbows. Slide your chair under your desk so you are close enough to maintain good posture. When working at a computer, place your monitor at a height where you are looking straight ahead and you do not have to  tilt your head forward or downward to look at the screen. Standing  When standing, keep your spine neutral and keep your feet about hip-width apart. Keep a slight bend in your knees. Your ears, shoulders, and hips should line up. When you do a task in which you stand in one place for a long time, place one foot up on a stable object that is 2-4 inches (5-10 cm) high, such as a footstool. This helps keep your spine neutral. Resting When lying down and resting, avoid positions that are most painful for you. Try to support your neck in a neutral position. You can use a contour pillow or a small rolled-up towel. Your pillow should support your neck but not push on it. This information is not intended to replace advice given to you by your health care provider. Make sure you discuss any questions you have with your health care provider. Document Revised: 12/22/2021 Document Reviewed: 12/22/2021 Elsevier Patient Education  2023 Elsevier Inc.  

## 2022-06-23 ENCOUNTER — Telehealth: Payer: Self-pay

## 2022-06-23 NOTE — Telephone Encounter (Signed)
PA has been approved   Cathy Stone (Key: [id number]) Rx #: [id number] Ozempic (1 MG/DOSE) 4MG [name] pen-injectors Form [employer] [name] [facility] Form (CB)

## 2022-08-05 ENCOUNTER — Other Ambulatory Visit: Payer: Self-pay | Admitting: Nurse Practitioner

## 2022-08-05 DIAGNOSIS — E119 Type 2 diabetes mellitus without complications: Secondary | ICD-10-CM

## 2022-08-06 MED ORDER — OZEMPIC (1 MG/DOSE) 4 MG/3ML ~~LOC~~ SOPN: 1.0000 mg | PEN_INJECTOR | SUBCUTANEOUS | 0 refills | Status: DC

## 2022-08-06 NOTE — Addendum Note (Signed)
Addended by: Antonietta Barcelona D on: 08/06/2022 11:26 AM   Modules accepted: Orders

## 2022-08-06 NOTE — Telephone Encounter (Signed)
TC from [facility] request came in this morning for Ozempic 1 mg/dose, pt has been doing the 1 mg weekly Was sent back as 3 mg weekly, this was incorrectly done by myself trying to get the qty to calculate correctly to send only 1 mos to add NTBS. Corrected script and sent back to pharmacy

## 2022-10-13 ENCOUNTER — Encounter: Payer: Self-pay | Admitting: Nurse Practitioner

## 2022-10-13 ENCOUNTER — Ambulatory Visit (INDEPENDENT_AMBULATORY_CARE_PROVIDER_SITE_OTHER): Payer: BC Managed Care – PPO

## 2022-10-13 ENCOUNTER — Ambulatory Visit (INDEPENDENT_AMBULATORY_CARE_PROVIDER_SITE_OTHER): Payer: BC Managed Care – PPO | Admitting: Nurse Practitioner

## 2022-10-13 VITALS — BP 114/80 | HR 90 | Temp 98.3°F | Resp 20 | Ht 67.0 in | Wt 239.0 lb

## 2022-10-13 DIAGNOSIS — K5901 Slow transit constipation: Secondary | ICD-10-CM | POA: Diagnosis not present

## 2022-10-13 DIAGNOSIS — R109 Unspecified abdominal pain: Secondary | ICD-10-CM

## 2022-10-13 NOTE — Patient Instructions (Signed)

## 2022-10-13 NOTE — Progress Notes (Signed)
   Subjective:    Patient ID: Cathy Stone, female    DOB: July 01, 1975, 47 y.o.   MRN: [id number]   Chief Complaint: Abdominal Pain   Abdominal Pain This is a new problem. The problem has been waxing and waning. The pain is located in the generalized abdominal region. The pain is at a severity of 7/10. The pain is moderate. The quality of the pain is cramping. The abdominal pain does not radiate. Associated symptoms include constipation and diarrhea. Pertinent negatives include no nausea or vomiting. Nothing aggravates the pain. The pain is relieved by Nothing. She has tried nothing for the symptoms.    Patient Active Problem List   Diagnosis Date Noted   Diabetes mellitus without complication (HCC) 02/23/2022   Hyperlipidemia associated with type 2 diabetes mellitus (HCC) 02/23/2022   BMI 39.0-39.9,adult 03/13/2015   Rhinitis, allergic 03/13/2015   Esophageal reflux 03/13/2015   Hyperglycemia 03/13/2015   Helicobacter positive gastritis 08/03/2014       Review of Systems  Gastrointestinal:  Positive for abdominal pain, constipation and diarrhea. Negative for nausea and vomiting.       Objective:   Physical Exam Constitutional:      Appearance: She is well-developed. She is obese.  Cardiovascular:     Rate and Rhythm: Normal rate and regular rhythm.     Heart sounds: Normal heart sounds.  Pulmonary:     Effort: Pulmonary effort is normal.     Breath sounds: Normal breath sounds.  Skin:    General: Skin is warm.  Neurological:     General: No focal deficit present.     Mental Status: She is alert and oriented to person, place, and time.  Psychiatric:        Mood and Affect: Mood normal.        Behavior: Behavior normal.     BP 114/80   Pulse 90   Temp 98.3 F (36.8 C) (Temporal)   Resp 20   Ht 5\' 7"  (1.702 m)   Wt 239 lb (108.4 kg)   SpO2 96%   BMI 37.43 kg/m   KUB- moderate stool burden-Preliminary reading by Paulene Floor, FNP  [facility]        Assessment & Plan:   [name] in today with chief complaint of Abdominal Pain   1. Abdominal pain, unspecified abdominal location - DG Abd 1 View  2. Slow transit constipation Dose of MIlk of Magnesia and 6 oz of prune juice- if  no result sin 6 hours repeat'oince have good results start on mirlax 2-3 x a week in apple juice Increase fiber in diet RYTO prn    The above assessment and management plan was discussed with the patient. The patient verbalized understanding of and has agreed to the management plan. Patient is aware to call the clinic if symptoms persist or worsen. Patient is aware when to return to the clinic for a follow-up visit. Patient educated on when it is appropriate to go to the emergency department.   Mary-Margaret Daphine Deutscher, FNP

## 2022-10-14 ENCOUNTER — Other Ambulatory Visit: Payer: Self-pay | Admitting: Nurse Practitioner

## 2022-10-14 DIAGNOSIS — E119 Type 2 diabetes mellitus without complications: Secondary | ICD-10-CM

## 2022-10-14 MED ORDER — OZEMPIC (1 MG/DOSE) 4 MG/3ML ~~LOC~~ SOPN: 1.0000 mg | PEN_INJECTOR | SUBCUTANEOUS | 0 refills | Status: DC

## 2022-10-14 NOTE — Telephone Encounter (Signed)
MMM NTBS 30 days given 08/06/22

## 2022-10-14 NOTE — Telephone Encounter (Signed)
I called pt & made her an appt on 11-11-2022 at 9:15am w/MMM for Med refill.

## 2022-10-14 NOTE — Addendum Note (Signed)
Addended by: Julious Payer D on: 10/14/2022 04:08 PM   Modules accepted: Orders

## 2022-11-10 ENCOUNTER — Ambulatory Visit (INDEPENDENT_AMBULATORY_CARE_PROVIDER_SITE_OTHER): Payer: BC Managed Care – PPO | Admitting: Nurse Practitioner

## 2022-11-10 VITALS — BP 114/66 | HR 75 | Temp 98.1°F | Ht 67.0 in | Wt 243.5 lb

## 2022-11-10 DIAGNOSIS — E1169 Type 2 diabetes mellitus with other specified complication: Secondary | ICD-10-CM

## 2022-11-10 DIAGNOSIS — K219 Gastro-esophageal reflux disease without esophagitis: Secondary | ICD-10-CM

## 2022-11-10 DIAGNOSIS — E119 Type 2 diabetes mellitus without complications: Secondary | ICD-10-CM

## 2022-11-10 DIAGNOSIS — E785 Hyperlipidemia, unspecified: Secondary | ICD-10-CM | POA: Diagnosis not present

## 2022-11-10 DIAGNOSIS — Z7985 Long-term (current) use of injectable non-insulin antidiabetic drugs: Secondary | ICD-10-CM

## 2022-11-10 LAB — BAYER DCA HB A1C WAIVED: HB A1C (BAYER DCA - WAIVED): 6.2 % — ABNORMAL HIGH (ref 4.8–5.6)

## 2022-11-10 MED ORDER — ESOMEPRAZOLE MAGNESIUM 20 MG PO CPDR: 20.0000 mg | DELAYED_RELEASE_CAPSULE | Freq: Every day | ORAL | 1 refills | Status: DC

## 2022-11-10 MED ORDER — SEMAGLUTIDE (2 MG/DOSE) 8 MG/3ML ~~LOC~~ SOPN: 2.0000 mg | PEN_INJECTOR | SUBCUTANEOUS | 3 refills | Status: DC

## 2022-11-10 NOTE — Patient Instructions (Signed)

## 2022-11-10 NOTE — Progress Notes (Signed)
Subjective:    Patient ID: Cathy Stone, female    DOB: 1975-11-21, 47 y.o.   MRN: [id number]   Chief Complaint: Medical Management of Chronic Issues and Diabetes    HPI:  Cathy Stone is a 47 y.o. who identifies as a female who was assigned female at birth.   Social history: Lives with: husband Work history: works for [employer] in today for follow up of the following chronic medical issues:  1. Diabetes mellitus without complication (HCC) On Ozempic; does not check blood sugars at home. Tries to watch sugar and carb intake.  Lab Results  Component Value Date   HGBA1C 6.0 (H) 02/23/2022     2. Hyperlipidemia associated with type 2 diabetes mellitus (HCC) Tries to watch her diet; no dedicated exercise. This SmartLink has not been configured with any valid records.   Lab Results  Component Value Date   CHOL 171 02/23/2022   HDL 45 02/23/2022   LDLCALC 111 (H) 02/23/2022   TRIG 78 02/23/2022   CHOLHDL 3.8 02/23/2022      New complaints: None today  Allergies  Allergen Reactions   Darvocet [Propoxyphene N-Acetaminophen] Rash   Outpatient Encounter Medications as of 11/10/2022  Medication Sig   blood glucose meter kit and supplies Dispense based on patient and insurance preference. Use up to four times daily as directed. (FOR ICD-10 E10.9, E11.9).   cetirizine (ZYRTEC) 10 MG tablet Take 1 tablet (10 mg total) by mouth daily.   esomeprazole (NEXIUM) 20 MG capsule Take 1 capsule (20 mg total) by mouth daily at 12 noon.   fluticasone (FLONASE) 50 MCG/ACT nasal spray Place 2 sprays into both nostrils daily.   Semaglutide, 1 MG/DOSE, (OZEMPIC, 1 MG/DOSE,) 4 MG/3ML SOPN Inject 1 mg into the skin once a week.   No facility-administered encounter medications on file as of 11/10/2022.    Past Surgical History:  Procedure Laterality Date   CESAREAN SECTION     DENTAL SURGERY      Family History  Problem Relation Age of Onset   Asthma Mother    COPD  Mother    Cancer Father    Diabetes Other       Controlled substance contract: n/a    Review of Systems  Constitutional:  Negative for appetite change and fatigue.  HENT: Negative.    Eyes:  Negative for pain and visual disturbance.  Respiratory:  Negative for chest tightness and shortness of breath.   Cardiovascular:  Negative for chest pain, palpitations and leg swelling.  Gastrointestinal:  Positive for constipation. Negative for abdominal pain.  Endocrine: Negative for polydipsia and polyuria.  Genitourinary:  Negative for difficulty urinating.  Musculoskeletal:  Negative for arthralgias.  Skin: Negative.   Neurological:  Negative for dizziness and headaches.  Hematological:  Negative for adenopathy. Does not bruise/bleed easily.  Psychiatric/Behavioral: Negative.         Objective:   Physical Exam Vitals and nursing note reviewed.  Constitutional:      General: She is not in acute distress.    Appearance: Normal appearance. She is well-groomed. She is not ill-appearing.  HENT:     Head: Normocephalic and atraumatic.     Right Ear: Tympanic membrane normal.     Left Ear: Tympanic membrane normal.     Nose: Nose normal.     Mouth/Throat:     Mouth: Mucous membranes are moist.     Pharynx: Oropharynx is clear.  Eyes:  Conjunctiva/sclera: Conjunctivae normal.     Pupils: Pupils are equal, round, and reactive to light.  Neck:     Thyroid: No thyroid mass, thyromegaly or thyroid tenderness.  Cardiovascular:     Rate and Rhythm: Normal rate and regular rhythm.     Pulses: Normal pulses.     Heart sounds: Normal heart sounds.  Pulmonary:     Effort: Pulmonary effort is normal.     Breath sounds: Normal breath sounds.  Abdominal:     General: Abdomen is flat. Bowel sounds are normal. There is no distension.     Palpations: Abdomen is soft. There is no mass.     Tenderness: There is no abdominal tenderness. There is no guarding or rebound.  Musculoskeletal:         General: Normal range of motion.     Cervical back: Normal range of motion. No rigidity or tenderness.  Lymphadenopathy:     Cervical: No cervical adenopathy.  Skin:    General: Skin is warm and dry.     Capillary Refill: Capillary refill takes less than 2 seconds.  Neurological:     General: No focal deficit present.     Mental Status: She is alert and oriented to person, place, and time.  Psychiatric:        Mood and Affect: Mood normal.        Behavior: Behavior normal.        Thought Content: Thought content normal.    BP 114/66   Pulse 75   Temp 98.1 F (36.7 C) (Temporal)   Ht 5\' 7"  (1.702 m)   Wt 243 lb 8 oz (110.5 kg)   SpO2 99%   BMI 38.14 kg/m   A1c today: 6.2        Assessment & Plan:   CASSEDY GUBA comes in today with chief complaint of Medical Management of Chronic Issues and Diabetes   Diagnosis and orders addressed:  1. Diabetes mellitus without complication (HCC) Increased Ozempic to 2mg  Continue to watch carb and sugar intake Daily exercise - CBC with Differential/Platelet - CMP14+EGFR - Bayer DCA Hb A1c Waived  2. Hyperlipidemia associated with type 2 diabetes mellitus (HCC) Low fat diet - Lipid panel  3. Gastroesophageal reflux disease, unspecified whether esophagitis present Avoid spicy foods Avoid eating 2 hours before bedtime - esomeprazole (NEXIUM) 20 MG capsule; Take 1 capsule (20 mg total) by mouth daily at 12 noon.  Dispense: 90 capsule; Refill: 1   Labs pending Health Maintenance reviewed Diet and exercise encouraged  Follow up plan: 6 months  Consuello Bossier, FNP student    Bennie Pierini, FNP

## 2022-11-11 ENCOUNTER — Ambulatory Visit: Payer: BC Managed Care – PPO | Admitting: Nurse Practitioner

## 2022-11-11 LAB — CBC WITH DIFFERENTIAL/PLATELET
Basophils Absolute: 0.1 10*3/uL (ref 0.0–0.2)
Basos: 1 %
EOS (ABSOLUTE): 0.2 10*3/uL (ref 0.0–0.4)
Eos: 2 %
Hematocrit: 37.6 % (ref 34.0–46.6)
Hemoglobin: 12.1 g/dL (ref 11.1–15.9)
Immature Grans (Abs): 0 10*3/uL (ref 0.0–0.1)
Immature Granulocytes: 0 %
Lymphocytes Absolute: 2.3 10*3/uL (ref 0.7–3.1)
Lymphs: 26 %
MCH: 26.9 pg (ref 26.6–33.0)
MCHC: 32.2 g/dL (ref 31.5–35.7)
MCV: 84 fL (ref 79–97)
Monocytes Absolute: 0.6 10*3/uL (ref 0.1–0.9)
Monocytes: 7 %
Neutrophils Absolute: 5.6 10*3/uL (ref 1.4–7.0)
Neutrophils: 64 %
Platelets: 404 10*3/uL (ref 150–450)
RBC: 4.5 x10E6/uL (ref 3.77–5.28)
RDW: 13.2 % (ref 11.7–15.4)
WBC: 8.8 10*3/uL (ref 3.4–10.8)

## 2022-11-11 LAB — CMP14+EGFR
ALT: 14 IU/L (ref 0–32)
AST: 14 IU/L (ref 0–40)
Albumin/Globulin Ratio: 1.2 (ref 1.2–2.2)
Albumin: 3.8 g/dL — ABNORMAL LOW (ref 3.9–4.9)
Alkaline Phosphatase: 94 IU/L (ref 44–121)
BUN/Creatinine Ratio: 11 (ref 9–23)
BUN: 9 mg/dL (ref 6–24)
Bilirubin Total: 0.2 mg/dL (ref 0.0–1.2)
CO2: 20 mmol/L (ref 20–29)
Calcium: 8.8 mg/dL (ref 8.7–10.2)
Chloride: 104 mmol/L (ref 96–106)
Creatinine, Ser: 0.79 mg/dL (ref 0.57–1.00)
Globulin, Total: 3.2 g/dL (ref 1.5–4.5)
Glucose: 109 mg/dL — ABNORMAL HIGH (ref 70–99)
Potassium: 3.8 mmol/L (ref 3.5–5.2)
Sodium: 140 mmol/L (ref 134–144)
Total Protein: 7 g/dL (ref 6.0–8.5)
eGFR: 95 mL/min/{1.73_m2} (ref >59)

## 2022-11-11 LAB — LIPID PANEL
Chol/HDL Ratio: 3.4 ratio (ref 0.0–4.4)
Cholesterol, Total: 154 mg/dL (ref 100–199)
HDL: 45 mg/dL (ref >39)
LDL Chol Calc (NIH): 92 mg/dL (ref 0–99)
Triglycerides: 89 mg/dL (ref 0–149)
VLDL Cholesterol Cal: 17 mg/dL (ref 5–40)

## 2022-11-13 IMAGING — DX DG CHEST 2V
2 series · 2 of 2 positions shown · non-contrast
Comparison: X-ray chest 11/24/2013.

CLINICAL DATA: Patient complains of chest congestion and
bronchitis.

EXAM:
CHEST - 2 VIEW

[chest pa]
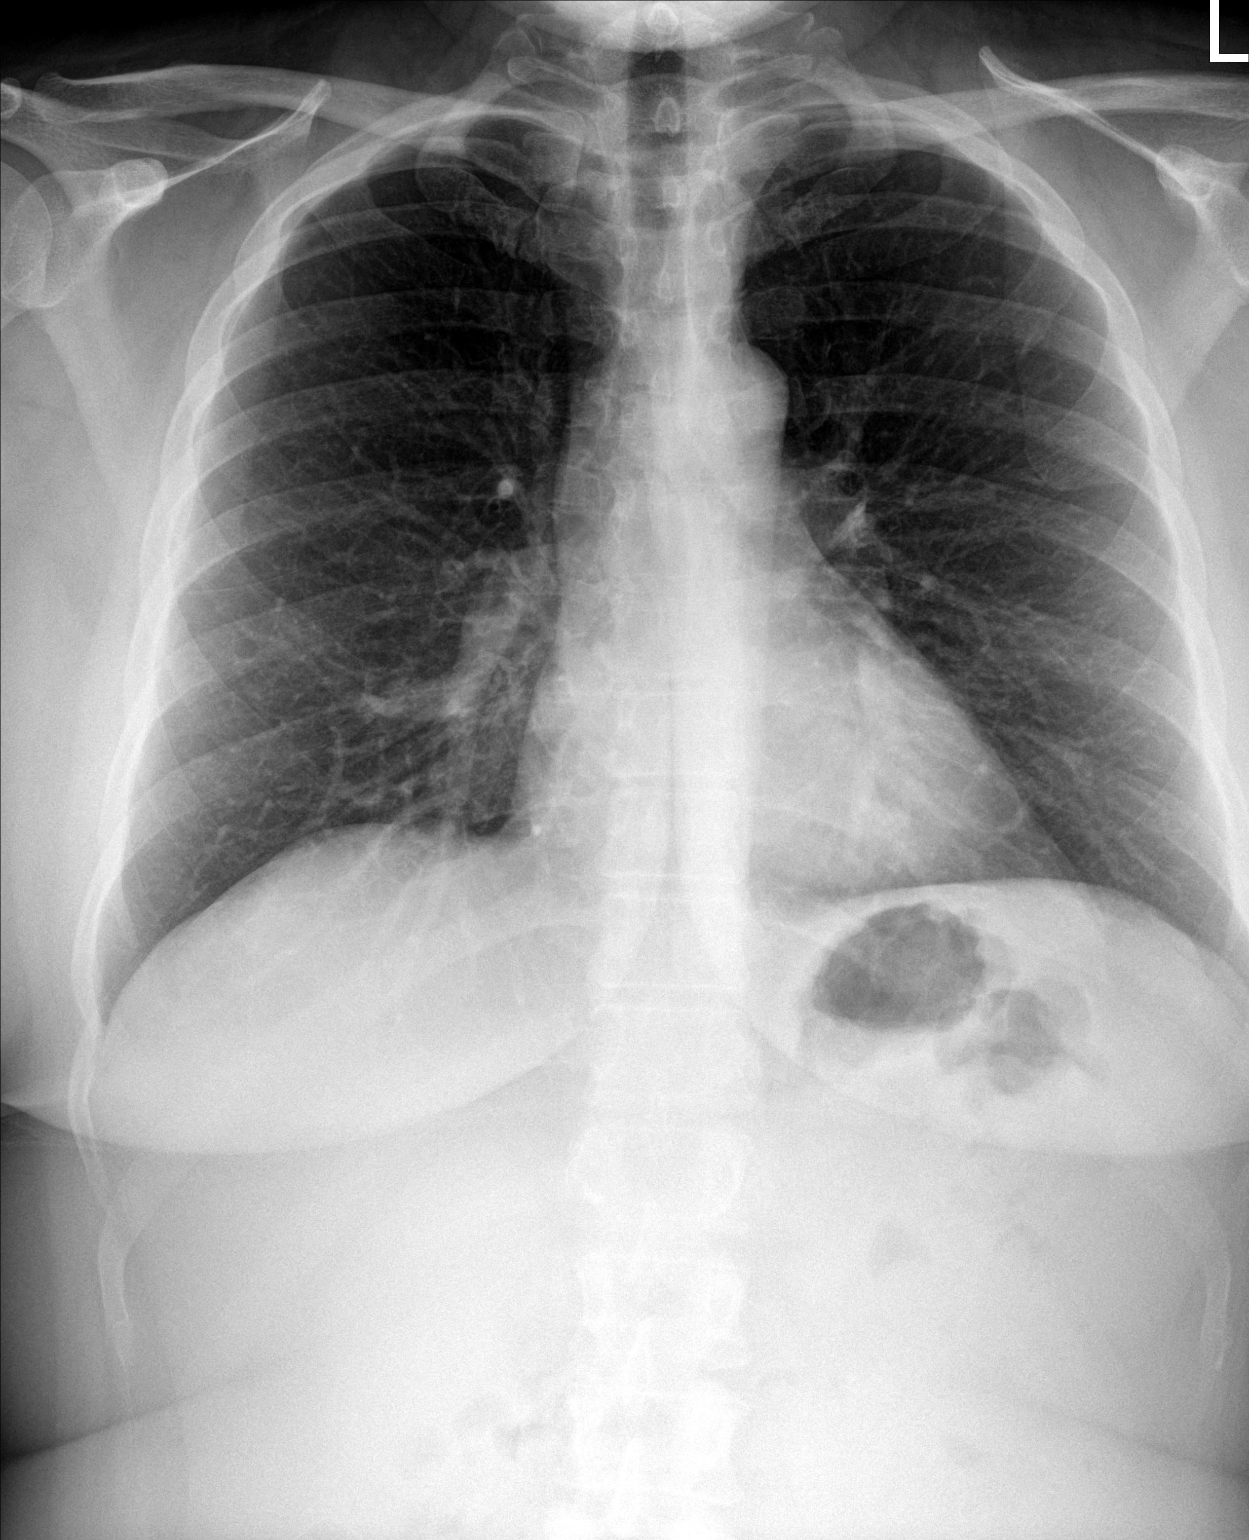

[chest lat]
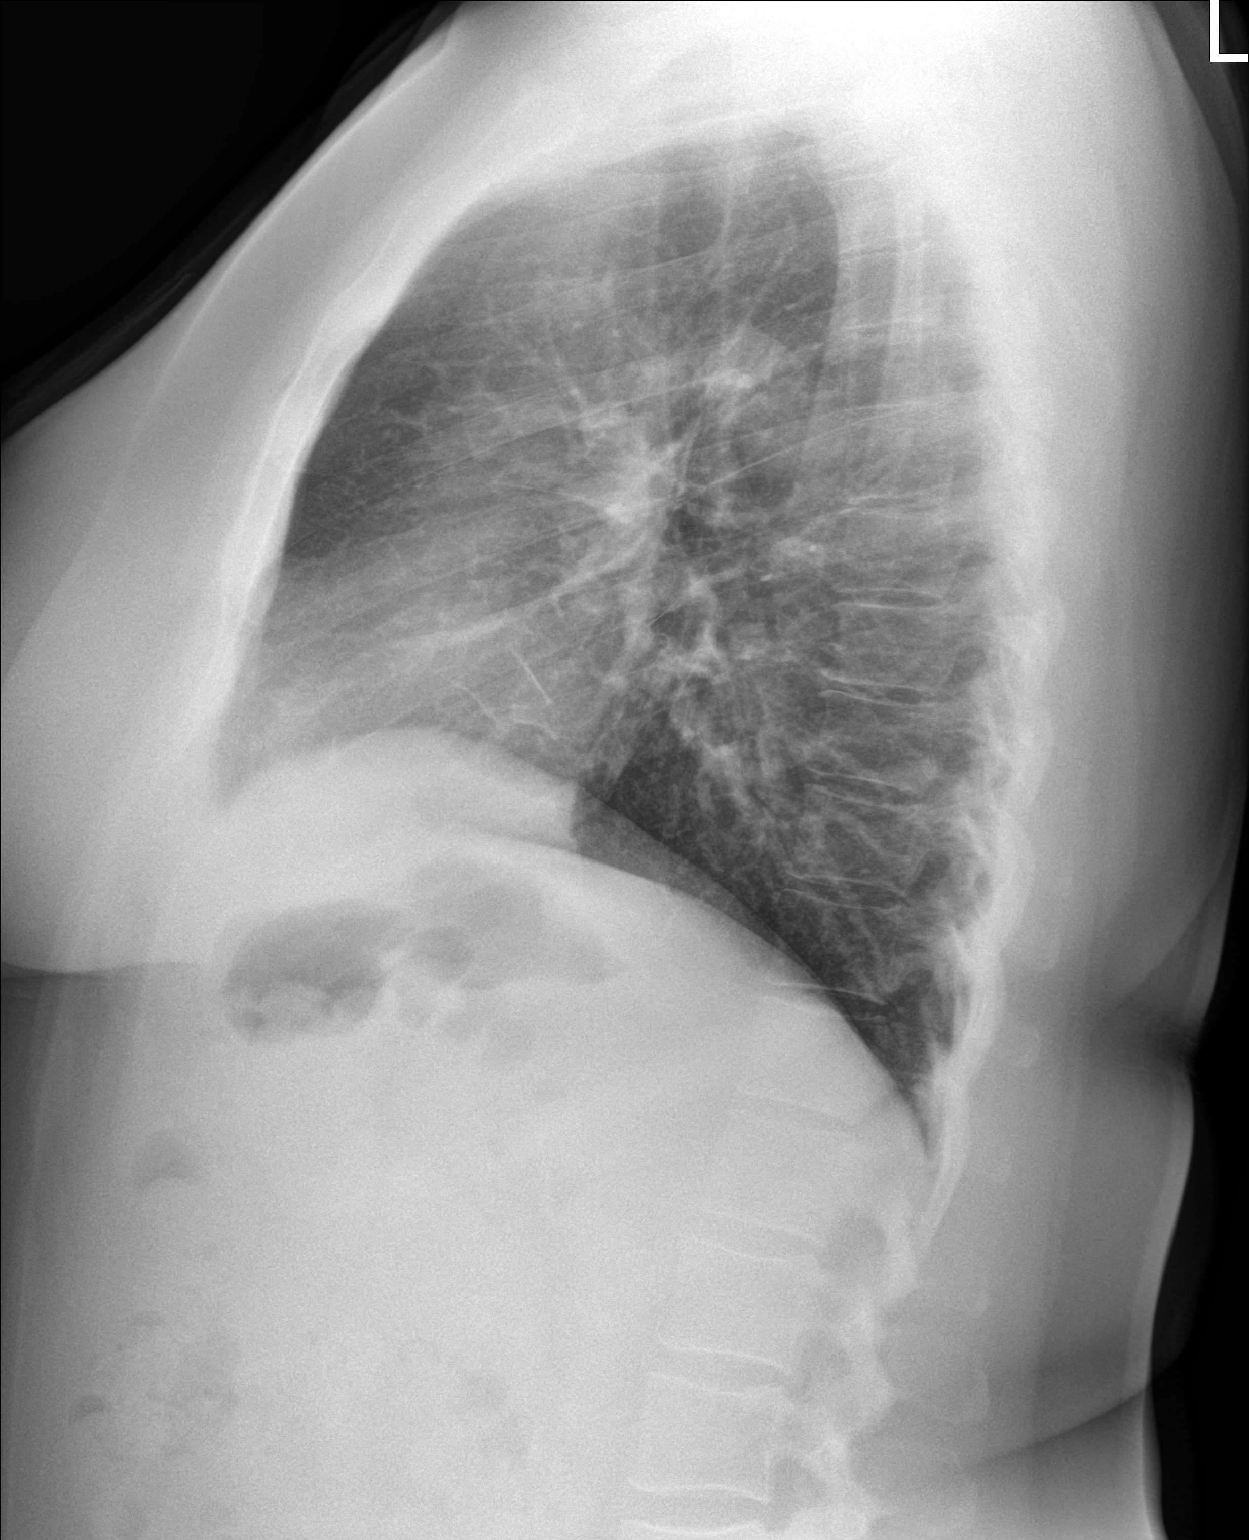

[2 of 2 positions shown; findings below may reference images not displayed]

FINDINGS: The cardiomediastinal silhouette is unchanged with normal heart
size. There is mild peribronchial thickening. No confluent airspace
opacity, edema, pleural effusion, pneumothorax is identified. No
acute osseous abnormality is seen.
IMPRESSION: Mild bronchitic changes.

## 2022-12-09 DIAGNOSIS — L821 Other seborrheic keratosis: Secondary | ICD-10-CM | POA: Diagnosis not present

## 2022-12-09 DIAGNOSIS — L918 Other hypertrophic disorders of the skin: Secondary | ICD-10-CM | POA: Diagnosis not present

## 2022-12-09 DIAGNOSIS — L814 Other melanin hyperpigmentation: Secondary | ICD-10-CM | POA: Diagnosis not present

## 2022-12-09 DIAGNOSIS — D225 Melanocytic nevi of trunk: Secondary | ICD-10-CM | POA: Diagnosis not present

## 2023-01-24 ENCOUNTER — Other Ambulatory Visit: Payer: Self-pay | Admitting: Nurse Practitioner

## 2023-01-24 DIAGNOSIS — E119 Type 2 diabetes mellitus without complications: Secondary | ICD-10-CM

## 2023-04-15 ENCOUNTER — Other Ambulatory Visit: Payer: Self-pay | Admitting: Nurse Practitioner

## 2023-05-11 ENCOUNTER — Ambulatory Visit: Payer: BC Managed Care – PPO | Admitting: Nurse Practitioner

## 2023-05-11 ENCOUNTER — Encounter: Payer: Self-pay | Admitting: Nurse Practitioner

## 2023-05-11 NOTE — Progress Notes (Deleted)
   Subjective:    Patient ID: Cathy Stone, female    DOB: 1976/03/09, 47 y.o.   MRN: [id number]   Chief Complaint: medical management of chronic issues     HPI:  Cathy Stone is a 47 y.o. who identifies as a female who was assigned female at birth.   Social history: Lives with: family Work history: works for a [employer] in today for follow up of the following chronic medical issues:  1. Gastroesophageal reflux disease, unspecified whether esophagitis present Is on nexium and is doing well  2. Hyperlipidemia associated with type 2 diabetes mellitus (HCC) Does try to watch diet but does not do much exercise. Lab Results  Component Value Date   CHOL 154 11/10/2022   HDL 45 11/10/2022   LDLCALC 92 11/10/2022   TRIG 89 11/10/2022   CHOLHDL 3.4 11/10/2022     3. Diabetes mellitus without complication (HCC) Fasting blood sugars are running around *** Lab Results  Component Value Date   HGBA1C 6.2 (H) 11/10/2022     4. BMI 39.0-39.9,adult No recent weight changes ***   New complaints: ***  Allergies  Allergen Reactions   Darvocet [Propoxyphene N-Acetaminophen] Rash   Outpatient Encounter Medications as of 05/11/2023  Medication Sig   blood glucose meter kit and supplies Dispense based on patient and insurance preference. Use up to four times daily as directed. (FOR ICD-10 E10.9, E11.9).   cetirizine (ZYRTEC) 10 MG tablet Take 1 tablet (10 mg total) by mouth daily.   esomeprazole (NEXIUM) 20 MG capsule Take 1 capsule (20 mg total) by mouth daily at 12 noon.   fluticasone (FLONASE) 50 MCG/ACT nasal spray Place 2 sprays into both nostrils daily.   Semaglutide, 2 MG/DOSE, (OZEMPIC, 2 MG/DOSE,) 8 MG/3ML SOPN INJECT 2 MG SUBCUTANEOUSLY AS DIRECTED ONCE WEEKLY   No facility-administered encounter medications on file as of 05/11/2023.    Past Surgical History:  Procedure Laterality Date   CESAREAN SECTION     DENTAL SURGERY      Family History   Problem Relation Age of Onset   Asthma Mother    COPD Mother    Cancer Father    Diabetes Other       Controlled substance contract: ***     Review of Systems     Objective:   Physical Exam        Assessment & Plan:

## 2023-05-12 ENCOUNTER — Telehealth: Payer: Self-pay | Admitting: Nurse Practitioner

## 2023-06-18 ENCOUNTER — Other Ambulatory Visit (HOSPITAL_COMMUNITY): Payer: Self-pay

## 2023-06-18 ENCOUNTER — Telehealth: Payer: Self-pay

## 2023-06-18 ENCOUNTER — Other Ambulatory Visit: Payer: Self-pay | Admitting: Nurse Practitioner

## 2023-06-18 MED ORDER — METFORMIN HCL ER (MOD) 500 MG PO TB24: 500.0000 mg | ORAL_TABLET | Freq: Every day | ORAL | 2 refills | Status: DC

## 2023-06-18 NOTE — Telephone Encounter (Signed)
 Patient cannot afford ozempic with new insurance- will change to metformin 500 XR- 1 po daily- will follow up Jan 21,2025

## 2023-06-18 NOTE — Telephone Encounter (Signed)
 Pharmacy Patient Advocate Encounter   Received notification from CoverMyMeds that prior authorization for Ozempic  (1 MG/DOSE) 4MG /3ML pen-injectors is required/requested.   Insurance verification completed.   The patient is insured through [employer] .   Per test claim: The current 30 day co-pay is, $1,725.00 **.  No PA needed at this time. This test claim was processed through [facility]- copay amounts may vary at other pharmacies due to pharmacy/plan contracts, or as the patient moves through the different stages of their insurance plan.     ** $1,650 covers the patient's deductible and estimated co-pay after is $75.00

## 2023-07-06 ENCOUNTER — Encounter: Payer: Self-pay | Admitting: Nurse Practitioner

## 2023-07-06 ENCOUNTER — Ambulatory Visit: Payer: BC Managed Care – PPO | Admitting: Nurse Practitioner

## 2023-07-06 NOTE — Progress Notes (Deleted)
Subjective:    Patient ID: Cathy Stone, female    DOB: 1976-06-12, 48 y.o.   MRN: [id number]   Chief Complaint: medical management of chronic issues     HPI:  Cathy Stone is a 48 y.o. who identifies as a female who was assigned female at birth.   Social history: Lives with: husband Work history: works for a [employer] in today for follow up of the following chronic medical issues:  1. Hyperlipidemia associated with type 2 diabetes mellitus (HCC) Has been watching diet but does no exercise. Lab Results  Component Value Date   CHOL 154 11/10/2022   HDL 45 11/10/2022   LDLCALC 92 11/10/2022   TRIG 89 11/10/2022   CHOLHDL 3.4 11/10/2022     2. Diabetes mellitus without complication (HCC) 3. Hyperglycemia Is on metformin and is doing well. Ozempic was added at last visit. She has not been able to check blood sugars at home. Lab Results  Component Value Date   HGBA1C 6.2 (H) 11/10/2022     4. Gastroesophageal reflux disease, unspecified whether esophagitis present Is on nexium and is doing well.  5. BMI 39.0-39.9,adult Weight is down 15lbs  ***    New complaints: None today  Allergies  Allergen Reactions   Darvocet [Propoxyphene N-Acetaminophen] Rash   Cathy Stone   blood glucose meter kit and supplies Dispense based on patient and insurance preference. Use up to four times daily as directed. (FOR ICD-10 E10.9, E11.9).   cetirizine (ZYRTEC) 10 MG tablet Take 1 tablet (10 mg total) by mouth daily.   esomeprazole (NEXIUM) 20 MG capsule Take 1 capsule (20 mg total) by mouth daily at 12 noon.   fluticasone (FLONASE) 50 MCG/ACT nasal spray Place 2 sprays into both nostrils daily.   metFORMIN (GLUMETZA) 500 MG (MOD) 24 hr tablet TAKE 1  BY MOUTH ONCE DAILY WITH BREAKFAST   Semaglutide, 2 MG/DOSE, (OZEMPIC, 2 MG/DOSE,) 8 MG/3ML SOPN INJECT 2 MG SUBCUTANEOUSLY AS DIRECTED ONCE WEEKLY   No  facility-administered encounter medications on file as of 07/06/2023.    Past Surgical History:  Procedure Laterality Date   CESAREAN SECTION     DENTAL SURGERY      Family History  Problem Relation Age of Onset   Asthma Mother    COPD Mother    Cancer Father    Diabetes Other       Controlled substance contract: n/a     Review of Systems  Constitutional:  Negative for diaphoresis.  Eyes:  Negative for pain.  Respiratory:  Negative for shortness of breath.   Cardiovascular:  Negative for chest pain, palpitations and leg swelling.  Gastrointestinal:  Negative for abdominal pain.  Endocrine: Negative for polydipsia.  Skin:  Negative for rash.  Neurological:  Negative for dizziness, weakness and headaches.  Hematological:  Does not bruise/bleed easily.  All other systems reviewed and are negative.      Objective:   Physical Exam Vitals and nursing note reviewed.  Constitutional:      General: She is not in acute distress.    Appearance: Normal appearance. She is well-developed.  HENT:     Head: Normocephalic.     Right Ear: Tympanic membrane normal.     Left Ear: Tympanic membrane normal.     Nose: Nose normal.     Mouth/Throat:     Mouth: Mucous membranes are moist.  Eyes:     Pupils: Pupils  are equal, round, and reactive to light.  Neck:     Vascular: No carotid bruit or JVD.  Cardiovascular:     Rate and Rhythm: Normal rate and regular rhythm.     Heart sounds: Normal heart sounds.  Pulmonary:     Effort: Pulmonary effort is normal. No respiratory distress.     Breath sounds: Normal breath sounds. No wheezing or rales.  Chest:     Chest wall: No tenderness.  Abdominal:     General: Bowel sounds are normal. There is no distension or abdominal bruit.     Palpations: Abdomen is soft. There is no hepatomegaly, splenomegaly, mass or pulsatile mass.     Tenderness: There is no abdominal tenderness.  Musculoskeletal:        General: Normal range of motion.      Cervical back: Normal range of motion and neck supple.  Lymphadenopathy:     Cervical: No cervical adenopathy.  Skin:    General: Skin is warm and dry.  Neurological:     Mental Status: She is alert and oriented to person, place, and time.     Deep Tendon Reflexes: Reflexes are normal and symmetric.  Psychiatric:        Behavior: Behavior normal.        Thought Content: Thought content normal.        Judgment: Judgment normal.    There were no vitals taken for this visit.   Hgba1c 6.0%     Assessment & Plan:   ELINORE HEFFLER comes in today with chief complaint of No chief complaint on file.   Diagnosis and orders addressed:  1. Hyperlipidemia associated with type 2 diabetes mellitus (HCC) Low fat diet - Lipid panel  2. Diabetes mellitus without complication (HCC) Continue  to wathc carbs in diet'return glucometer to pharmacy and see if they will repleace it - Bayer DCA Hb A1c Waived - CBC with Differential/Platelet - CMP14+EGFR - Semaglutide, 1 MG/DOSE, 4 MG/3ML SOPN; Inject 1 mg as directed once a week.  Dispense: 3 mL; Refill: 3  3. Hyperglycemia  4. Gastroesophageal reflux disease, unspecified whether esophagitis present Avoid spicy foods Do not eat 2 hours prior to bedtime  - esomeprazole (NEXIUM) 20 MG capsule; Take 1 capsule (20 mg total) by mouth daily at 12 noon.  Dispense: 90 capsule; Refill: 1  5. BMI 39.0-39.9,adult Discussed diet and exercise for person with BMI >25 Will recheck weight in 3-6 months    Labs pending Health Maintenance reviewed Diet and exercise encouraged  Follow up plan: 3 months   Mary-Margaret Daphine Deutscher, FNP

## 2023-07-19 ENCOUNTER — Encounter: Payer: Self-pay | Admitting: Nurse Practitioner

## 2023-07-19 ENCOUNTER — Ambulatory Visit (INDEPENDENT_AMBULATORY_CARE_PROVIDER_SITE_OTHER): Payer: BC Managed Care – PPO | Admitting: Nurse Practitioner

## 2023-07-19 ENCOUNTER — Other Ambulatory Visit: Payer: Self-pay | Admitting: Nurse Practitioner

## 2023-07-19 VITALS — BP 116/68 | HR 86 | Temp 98.1°F | Ht 67.0 in | Wt 247.0 lb

## 2023-07-19 DIAGNOSIS — Z6838 Body mass index (BMI) 38.0-38.9, adult: Secondary | ICD-10-CM

## 2023-07-19 DIAGNOSIS — K219 Gastro-esophageal reflux disease without esophagitis: Secondary | ICD-10-CM | POA: Diagnosis not present

## 2023-07-19 DIAGNOSIS — E119 Type 2 diabetes mellitus without complications: Secondary | ICD-10-CM | POA: Diagnosis not present

## 2023-07-19 DIAGNOSIS — E785 Hyperlipidemia, unspecified: Secondary | ICD-10-CM | POA: Diagnosis not present

## 2023-07-19 DIAGNOSIS — E1169 Type 2 diabetes mellitus with other specified complication: Secondary | ICD-10-CM

## 2023-07-19 DIAGNOSIS — Z7985 Long-term (current) use of injectable non-insulin antidiabetic drugs: Secondary | ICD-10-CM

## 2023-07-19 LAB — LIPID PANEL

## 2023-07-19 LAB — BAYER DCA HB A1C WAIVED: HB A1C (BAYER DCA - WAIVED): 5 % (ref 4.8–5.6)

## 2023-07-19 MED ORDER — OMEPRAZOLE 40 MG PO CPDR: 40.0000 mg | DELAYED_RELEASE_CAPSULE | Freq: Every day | ORAL | 1 refills | Status: DC

## 2023-07-19 MED ORDER — METFORMIN HCL 500 MG PO TABS: 500.0000 mg | ORAL_TABLET | Freq: Two times a day (BID) | ORAL | 1 refills | Status: DC

## 2023-07-19 MED ORDER — ESOMEPRAZOLE MAGNESIUM 20 MG PO CPDR: 20.0000 mg | DELAYED_RELEASE_CAPSULE | Freq: Every day | ORAL | 1 refills | Status: DC

## 2023-07-19 NOTE — Progress Notes (Signed)
Subjective:    Patient ID: Cathy Stone, female    DOB: [birth year], 48 y.o.   MRN: [id number]   Chief Complaint: medical management of chronic issues     HPI:  Cathy Stone is a 48 y.o. who identifies as a female who was assigned female at birth.   Social history: Lives with: husband Work history: works for [employer] in today for follow up of the following chronic medical issues:  1. Diabetes mellitus without complication (HCC) Does not check blood sugars at home. Tries to watch sugar and carb intake. Has been on ozempic and just took her last injection. Insurance does not cover it well since the beginning of the year. She is going to start back on metformin next week.  Lab Results  Component Value Date   HGBA1C 6.2 (H) 11/10/2022     2. Hyperlipidemia associated with type 2 diabetes mellitus (HCC) Tries to watch her diet; no dedicated exercise. This SmartLink has not been configured with any valid records.   Lab Results  Component Value Date   CHOL 154 11/10/2022   HDL 45 11/10/2022   LDLCALC 92 11/10/2022   TRIG 89 11/10/2022   CHOLHDL 3.4 11/10/2022   3. GERD Is on nexium daily and is doing well.  4. BMI 37.0-37.9 Weight is up 4 lbs Wt Readings from Last 3 Encounters:  07/19/23 247 lb (112 kg)  11/10/22 243 lb 8 oz (110.5 kg)  10/13/22 239 lb (108.4 kg)   BMI Readings from Last 3 Encounters:  07/19/23 38.69 kg/m  11/10/22 38.14 kg/m  10/13/22 37.43 kg/m      New complaints: None today  Allergies  Allergen Reactions   Darvocet [Propoxyphene N-Acetaminophen] Rash   Outpatient Encounter Medications as of 07/19/2023  Medication Sig   blood glucose meter kit and supplies Dispense based on patient and insurance preference. Use up to four times daily as directed. (FOR ICD-10 E10.9, E11.9).   cetirizine (ZYRTEC) 10 MG tablet Take 1 tablet (10 mg total) by mouth daily.   esomeprazole (NEXIUM) 20 MG capsule Take 1 capsule (20 mg total) by  mouth daily at 12 noon.   fluticasone (FLONASE) 50 MCG/ACT nasal spray Place 2 sprays into both nostrils daily.   metFORMIN (GLUMETZA) 500 MG (MOD) 24 hr tablet TAKE 1  BY MOUTH ONCE DAILY WITH BREAKFAST   Semaglutide, 2 MG/DOSE, (OZEMPIC, 2 MG/DOSE,) 8 MG/3ML SOPN INJECT 2 MG SUBCUTANEOUSLY AS DIRECTED ONCE WEEKLY   No facility-administered encounter medications on file as of 07/19/2023.    Past Surgical History:  Procedure Laterality Date   CESAREAN SECTION     DENTAL SURGERY      Family History  Problem Relation Age of Onset   Asthma Mother    COPD Mother    Cancer Father    Diabetes Other       Controlled substance contract: n/a    Review of Systems  Constitutional:  Negative for appetite change and fatigue.  HENT: Negative.    Eyes:  Negative for pain and visual disturbance.  Respiratory:  Negative for chest tightness and shortness of breath.   Cardiovascular:  Negative for chest pain, palpitations and leg swelling.  Gastrointestinal:  Positive for constipation. Negative for abdominal pain.  Endocrine: Negative for polydipsia and polyuria.  Genitourinary:  Negative for difficulty urinating.  Musculoskeletal:  Negative for arthralgias.  Skin: Negative.   Neurological:  Negative for dizziness and headaches.  Hematological:  Negative for adenopathy. Does not  bruise/bleed easily.  Psychiatric/Behavioral: Negative.         Objective:   Physical Exam Vitals and nursing note reviewed.  Constitutional:      General: She is not in acute distress.    Appearance: Normal appearance. She is well-groomed. She is not ill-appearing.  HENT:     Head: Normocephalic and atraumatic.     Right Ear: Tympanic membrane normal.     Left Ear: Tympanic membrane normal.     Nose: Nose normal.     Mouth/Throat:     Mouth: Mucous membranes are moist.     Pharynx: Oropharynx is clear.  Eyes:     Conjunctiva/sclera: Conjunctivae normal.     Pupils: Pupils are equal, round, and  reactive to light.  Neck:     Thyroid: No thyroid mass, thyromegaly or thyroid tenderness.  Cardiovascular:     Rate and Rhythm: Normal rate and regular rhythm.     Pulses: Normal pulses.     Heart sounds: Normal heart sounds.  Pulmonary:     Effort: Pulmonary effort is normal.     Breath sounds: Normal breath sounds.  Abdominal:     General: Abdomen is flat. Bowel sounds are normal. There is no distension.     Palpations: Abdomen is soft. There is no mass.     Tenderness: There is no abdominal tenderness. There is no guarding or rebound.  Musculoskeletal:        General: Normal range of motion.     Cervical back: Normal range of motion. No rigidity or tenderness.  Lymphadenopathy:     Cervical: No cervical adenopathy.  Skin:    General: Skin is warm and dry.     Capillary Refill: Capillary refill takes less than 2 seconds.  Neurological:     General: No focal deficit present.     Mental Status: She is alert and oriented to person, place, and time.  Psychiatric:        Mood and Affect: Mood normal.        Behavior: Behavior normal.        Thought Content: Thought content normal.   There were no vitals taken for this visit.  A1c today: 5.0        Assessment & Plan:   Cathy Stone comes in today with chief complaint of medical management of chronic issues    Diagnosis and orders addressed:  1. Diabetes mellitus without complication (HCC) P\atient going to call insurance about ozempic metformin 500mg  bid  Continue to watch carb and sugar intake Daily exercise - CBC with Differential/Platelet - CMP14+EGFR - Bayer DCA Hb A1c Waived  2. Hyperlipidemia associated with type 2 diabetes mellitus (HCC) Low fat diet - Lipid panel  3. Gastroesophageal reflux disease, unspecified whether esophagitis present Avoid spicy foods Avoid eating 2 hours before bedtime - esomeprazole (NEXIUM) 20 MG capsule; Take 1 capsule (20 mg total) by mouth daily at 12 noon.  Dispense:  90 capsule; Refill: 1  4. BMI 38.0-38.9 Discussed diet and exercise for person with BMI >25 Will recheck weight in 3-6 months  Labs pending Health Maintenance reviewed Diet and exercise encouraged  Follow up plan: 6 months    Mary-Margaret Daphine Deutscher, FNP

## 2023-07-19 NOTE — Telephone Encounter (Signed)
Name from pharmacy: ESOMEPRAZOLE DR 20MG  CAP  Pharmacy comment: NDC not covered by insurance.

## 2023-07-20 LAB — CMP14+EGFR
ALT: 10 IU/L (ref 0–32)
AST: 13 IU/L (ref 0–40)
Albumin: 3.9 g/dL (ref 3.9–4.9)
Alkaline Phosphatase: 102 IU/L (ref 44–121)
BUN/Creatinine Ratio: 12 (ref 9–23)
BUN: 10 mg/dL (ref 6–24)
Bilirubin Total: 0.3 mg/dL (ref 0.0–1.2)
CO2: 21 mmol/L (ref 20–29)
Calcium: 9.2 mg/dL (ref 8.7–10.2)
Chloride: 104 mmol/L (ref 96–106)
Creatinine, Ser: 0.86 mg/dL (ref 0.57–1.00)
Globulin, Total: 3.4 g/dL (ref 1.5–4.5)
Glucose: 94 mg/dL (ref 70–99)
Potassium: 4.4 mmol/L (ref 3.5–5.2)
Sodium: 140 mmol/L (ref 134–144)
Total Protein: 7.3 g/dL (ref 6.0–8.5)
eGFR: 85 mL/min/{1.73_m2} (ref >59)

## 2023-07-20 LAB — MICROALBUMIN / CREATININE URINE RATIO
Creatinine, Urine: 167.9 mg/dL
Microalb/Creat Ratio: 3 mg/g{creat} (ref 0–29)
Microalbumin, Urine: 4.3 ug/mL

## 2023-07-20 LAB — CBC WITH DIFFERENTIAL/PLATELET
Basophils Absolute: 0.1 10*3/uL (ref 0.0–0.2)
Basos: 1 %
EOS (ABSOLUTE): 0.2 10*3/uL (ref 0.0–0.4)
Eos: 2 %
Hematocrit: 39.4 % (ref 34.0–46.6)
Hemoglobin: 12.6 g/dL (ref 11.1–15.9)
Immature Grans (Abs): 0 10*3/uL (ref 0.0–0.1)
Immature Granulocytes: 0 %
Lymphocytes Absolute: 2.9 10*3/uL (ref 0.7–3.1)
Lymphs: 25 %
MCH: 26.9 pg (ref 26.6–33.0)
MCHC: 32 g/dL (ref 31.5–35.7)
MCV: 84 fL (ref 79–97)
Monocytes Absolute: 0.9 10*3/uL (ref 0.1–0.9)
Monocytes: 8 %
Neutrophils Absolute: 7.4 10*3/uL — ABNORMAL HIGH (ref 1.4–7.0)
Neutrophils: 64 %
Platelets: 413 10*3/uL (ref 150–450)
RBC: 4.69 x10E6/uL (ref 3.77–5.28)
RDW: 13.2 % (ref 11.7–15.4)
WBC: 11.6 10*3/uL — ABNORMAL HIGH (ref 3.4–10.8)

## 2023-07-20 LAB — LIPID PANEL
Cholesterol, Total: 164 mg/dL (ref 100–199)
HDL: 46 mg/dL (ref >39)
LDL CALC COMMENT:: 3.6 ratio (ref 0.0–4.4)
LDL Chol Calc (NIH): 103 mg/dL — ABNORMAL HIGH (ref 0–99)
Triglycerides: 78 mg/dL (ref 0–149)
VLDL Cholesterol Cal: 15 mg/dL (ref 5–40)

## 2023-09-15 ENCOUNTER — Telehealth: Payer: Self-pay | Admitting: Nurse Practitioner

## 2024-01-17 ENCOUNTER — Ambulatory Visit: Payer: BC Managed Care – PPO | Admitting: Nurse Practitioner

## 2024-02-15 ENCOUNTER — Ambulatory Visit (INDEPENDENT_AMBULATORY_CARE_PROVIDER_SITE_OTHER): Admitting: Nurse Practitioner

## 2024-02-15 DIAGNOSIS — Z91199 Patient's noncompliance with other medical treatment and regimen due to unspecified reason: Secondary | ICD-10-CM

## 2024-02-15 NOTE — Progress Notes (Unsigned)
Subjective    No show

## 2024-02-17 ENCOUNTER — Encounter: Payer: Self-pay | Admitting: Nurse Practitioner

## 2024-02-24 DIAGNOSIS — D485 Neoplasm of uncertain behavior of skin: Secondary | ICD-10-CM | POA: Diagnosis not present

## 2024-02-24 DIAGNOSIS — S20369A Insect bite (nonvenomous) of unspecified front wall of thorax, initial encounter: Secondary | ICD-10-CM | POA: Diagnosis not present

## 2024-02-24 DIAGNOSIS — L814 Other melanin hyperpigmentation: Secondary | ICD-10-CM | POA: Diagnosis not present

## 2024-02-24 DIAGNOSIS — L821 Other seborrheic keratosis: Secondary | ICD-10-CM | POA: Diagnosis not present

## 2024-03-06 ENCOUNTER — Ambulatory Visit (INDEPENDENT_AMBULATORY_CARE_PROVIDER_SITE_OTHER): Admitting: Nurse Practitioner

## 2024-03-06 ENCOUNTER — Encounter: Payer: Self-pay | Admitting: Nurse Practitioner

## 2024-03-06 VITALS — BP 129/80 | HR 85 | Temp 97.9°F | Ht 67.0 in | Wt 264.0 lb

## 2024-03-06 DIAGNOSIS — R300 Dysuria: Secondary | ICD-10-CM | POA: Diagnosis not present

## 2024-03-06 DIAGNOSIS — R350 Frequency of micturition: Secondary | ICD-10-CM

## 2024-03-06 LAB — URINALYSIS, ROUTINE W REFLEX MICROSCOPIC
Bilirubin, UA: NEGATIVE
Glucose, UA: NEGATIVE
Ketones, UA: NEGATIVE
Leukocytes,UA: NEGATIVE
Nitrite, UA: NEGATIVE
Protein,UA: NEGATIVE
Specific Gravity, UA: 1.02 (ref 1.005–1.030)
Urobilinogen, Ur: 0.2 mg/dL (ref 0.2–1.0)
pH, UA: 6.5 (ref 5.0–7.5)

## 2024-03-06 LAB — MICROSCOPIC EXAMINATION: RBC, Urine: NONE SEEN /HPF (ref 0–2)

## 2024-03-06 NOTE — Patient Instructions (Signed)
 Peeing Often (Urinary Frequency) in Adults Urinary frequency means peeing, or urinating, more often than usual. This is also called overactive bladder. People usually pee 6 or 7 times in 24 hours. If you're concerned that you pee more often than usual, or if peeing often is starting to affect your day-to-day life, you may have an overactive bladder. How often you pee depends on: How much you drink and what you drink. How much you exercise. What medicines you're taking. The health of your bladder muscles and pelvic muscles. If you pee often, you may also feel an urgent need to pee. The stress and anxiety of needing to find a bathroom quickly can make this problem feel worse. This problem may go away on its own. You may also wish to seek treatment. Home treatments are the first step in treating the problem. Surgery may be done if home treatment fails. Follow these instructions at home: Bladder health Your health care provider can suggest ways to help you improve your bladder health. You may be told to: Start a bladder diary. Keep track of these things: How often you pee. How much you pee at one time. What you eat and drink. If you leak any pee. Follow a bladder training program. You may be told to: Wait before going to the bathroom. In doing this, you learn to ignore some of the urges to pee. Wait a minute after you pee and try to pee again. This helps if you feel you are not completely emptying your bladder. Set a time to pee. The goal is to slowly increase the amount of time in which you wait to pee. Do Kegel exercises. These exercises help the muscles that control peeing to become stronger. This may help you control the urge to pee.  Eating and drinking Eat and drink as told. You may be told to: Avoid caffeine. Drink small amounts of fluid more often. Avoid drinking in the evening. Stop drinking alcohol. Avoid foods or drinks that may irritate the bladder, such as: Coffee, tea, or  soda. Artificial sweeteners. Citrus fruits, tomato-based foods, and chocolate. Urinary frequency may be worse if you have trouble pooping (constipation). You may need to take these steps to help prevent or treat the problem: Take medicines to help you poop. Eat foods high in fiber, like beans, whole grains, and fresh fruits and vegetables. Drink more fluids as told. General instructions Take your medicines only as told. Keep all follow-up visits. Your provider needs to check if the home treatments are working or if you need more treatment. Contact a health care provider if: You start peeing more than before. You feel pain when you pee. You notice blood in your pee. Your pee looks cloudy. You have a fever or chills. You throw up or you feel like you may throw up. Get help right away if: You can't pee. This information is not intended to replace advice given to you by your health care provider. Make sure you discuss any questions you have with your health care provider. Document Revised: 03/11/2023 Document Reviewed: 03/11/2023 Elsevier Patient Education  2025 [employer].

## 2024-03-06 NOTE — Progress Notes (Signed)
   Subjective:    Patient ID: Cathy Stone, female    DOB: 04-25-1976, 48 y.o.   MRN: [id number]   Chief Complaint: Urinary Frequency   Urinary Frequency  This is a new problem. The current episode started in the past 7 days. The problem occurs every urination. The problem has been waxing and waning. The pain is at a severity of 0/10. The patient is experiencing no pain. There has been no fever. She is Sexually active. There is No history of pyelonephritis. Associated symptoms include frequency. Pertinent negatives include no discharge, flank pain, hesitancy or urgency. She has tried nothing for the symptoms. The treatment provided no relief.    Patient Active Problem List   Diagnosis Date Noted   Diabetes mellitus without complication (HCC) 02/23/2022   Hyperlipidemia associated with type 2 diabetes mellitus (HCC) 02/23/2022   BMI 39.0-39.9,adult 03/13/2015   Rhinitis, allergic 03/13/2015   GERD (gastroesophageal reflux disease) 03/13/2015   Helicobacter positive gastritis 08/03/2014       Review of Systems  Constitutional:  Negative for fatigue and fever.  Genitourinary:  Positive for frequency. Negative for dysuria, flank pain, hesitancy and urgency.       Objective:   Physical Exam Constitutional:      Appearance: Normal appearance. She is obese.  Cardiovascular:     Rate and Rhythm: Normal rate and regular rhythm.     Heart sounds: Normal heart sounds.  Pulmonary:     Effort: Pulmonary effort is normal.     Breath sounds: Normal breath sounds.  Abdominal:     Tenderness: There is no right CVA tenderness or left CVA tenderness.  Skin:    General: Skin is warm.  Neurological:     General: No focal deficit present.     Mental Status: She is alert and oriented to person, place, and time.  Psychiatric:        Mood and Affect: Mood normal.        Behavior: Behavior normal.     BP 129/80   Pulse 85   Temp 97.9 F (36.6 C) (Temporal)   Ht 5' 7 (1.702 m)    Wt 264 lb (119.7 kg)   SpO2 96%   BMI 41.35 kg/m   Urine is clear     Assessment & Plan:  Cathy Stone New in today with chief complaint of Urinary Frequency   1. Urinary frequency (Primary) Cranberry juice Force fluids RTO prn - Urinalysis, Routine w reflex microscopic - Urine Culture    The above assessment and management plan was discussed with the patient. The patient verbalized understanding of and has agreed to the management plan. Patient is aware to call the clinic if symptoms persist or worsen. Patient is aware when to return to the clinic for a follow-up visit. Patient educated on when it is appropriate to go to the emergency department.   Mary-Margaret Gladis, FNP

## 2024-03-08 LAB — URINE CULTURE

## 2024-03-09 ENCOUNTER — Ambulatory Visit: Payer: Self-pay | Admitting: Nurse Practitioner

## 2024-03-13 ENCOUNTER — Ambulatory Visit: Admitting: Nurse Practitioner

## 2024-03-28 ENCOUNTER — Other Ambulatory Visit (HOSPITAL_COMMUNITY): Payer: Self-pay | Admitting: Nurse Practitioner

## 2024-03-28 DIAGNOSIS — Z1231 Encounter for screening mammogram for malignant neoplasm of breast: Secondary | ICD-10-CM

## 2024-03-30 ENCOUNTER — Telehealth: Payer: Self-pay | Admitting: Emergency Medicine

## 2024-03-30 DIAGNOSIS — J40 Bronchitis, not specified as acute or chronic: Secondary | ICD-10-CM | POA: Diagnosis not present

## 2024-03-30 MED ORDER — BENZONATATE 100 MG PO CAPS: 100.0000 mg | ORAL_CAPSULE | Freq: Two times a day (BID) | ORAL | 0 refills | Status: AC | PRN

## 2024-03-30 MED ORDER — PREDNISONE 10 MG (21) PO TBPK: ORAL_TABLET | ORAL | 0 refills | Status: DC

## 2024-03-30 MED ORDER — ALBUTEROL SULFATE HFA 108 (90 BASE) MCG/ACT IN AERS: 2.0000 | INHALATION_SPRAY | Freq: Four times a day (QID) | RESPIRATORY_TRACT | 0 refills | Status: AC | PRN

## 2024-03-30 NOTE — Patient Instructions (Addendum)
 Cathy Stone, thank you for joining Jon CHRISTELLA Belt, NP for today's virtual visit.  While this provider is not your primary care provider (PCP), if your PCP is located in our provider database this encounter information will be shared with them immediately following your visit.   A [facility] MyChart account gives you access to today's visit and all your visits, tests, and labs performed at [facility]  click here if you don't have a [facility] MyChart account or go to [email or web address]  Consent: (Patient) Cathy Stone provided verbal consent for this virtual visit at the beginning of the encounter.  Current Medications:  Current Outpatient Medications:    albuterol  (VENTOLIN  HFA) 108 (90 Base) MCG/ACT inhaler, Inhale 2 puffs into the lungs every 6 (six) hours as needed for wheezing or shortness of breath., Disp: 8 g, Rfl: 0   benzonatate  (TESSALON ) 100 MG capsule, Take 1 capsule (100 mg total) by mouth 2 (two) times daily as needed for cough., Disp: 20 capsule, Rfl: 0   predniSONE  (STERAPRED UNI-PAK 21 TAB) 10 MG (21) TBPK tablet, Take 6 tabs on day 1; take 5 tabs day 2; take 4 tabs day 3; take 3 tabs day 4; take 2 tabs day 5; take 1 tab day 6, Disp: 21 tablet, Rfl: 0   blood glucose meter kit and supplies, Dispense based on patient and insurance preference. Use up to four times daily as directed. (FOR ICD-10 E10.9, E11.9)., Disp: 1 each, Rfl: 0   cetirizine  (ZYRTEC ) 10 MG tablet, Take 1 tablet (10 mg total) by mouth daily., Disp: 90 tablet, Rfl: 0   fluticasone  (FLONASE ) 50 MCG/ACT nasal spray, Place 2 sprays into both nostrils daily., Disp: 16 g, Rfl: 6   metFORMIN  (GLUCOPHAGE ) 500 MG tablet, Take 1 tablet (500 mg total) by mouth 2 (two) times daily with a meal., Disp: 180 tablet, Rfl: 1   omeprazole  (PRILOSEC) 40 MG capsule, Take 1 capsule (40 mg total) by mouth daily., Disp: 90 capsule, Rfl: 1   Semaglutide , 2 MG/DOSE, (OZEMPIC , 2 MG/DOSE,) 8 MG/3ML SOPN, INJECT  2 MG SUBCUTANEOUSLY AS DIRECTED ONCE WEEKLY, Disp: 9 mL, Rfl: 0   Medications ordered in this encounter:  Meds ordered this encounter  Medications   predniSONE  (STERAPRED UNI-PAK 21 TAB) 10 MG (21) TBPK tablet    Sig: Take 6 tabs on day 1; take 5 tabs day 2; take 4 tabs day 3; take 3 tabs day 4; take 2 tabs day 5; take 1 tab day 6    Dispense:  21 tablet    Refill:  0   albuterol  (VENTOLIN  HFA) 108 (90 Base) MCG/ACT inhaler    Sig: Inhale 2 puffs into the lungs every 6 (six) hours as needed for wheezing or shortness of breath.    Dispense:  8 g    Refill:  0   benzonatate  (TESSALON ) 100 MG capsule    Sig: Take 1 capsule (100 mg total) by mouth 2 (two) times daily as needed for cough.    Dispense:  20 capsule    Refill:  0     *If you need refills on other medications prior to your next appointment, please contact your pharmacy*  Follow-Up: Call back or seek an in-person evaluation if the symptoms worsen or if the condition fails to improve as anticipated.  [facility] [phone]  Other Instructions I do not think you have a bacterial infection at this time. Bronchitis is typically a viral illness and antibiotics  will not help. If you develop a fever or if your symptoms are not improving with the medicines prescribed today and you have been sick for more than 7 days, get evaluated again. You can try an evisit with [facility] at [facility].   Continue Mucinex  and make sure to drink plenty of fluids.    If you have been instructed to have an in-person evaluation today at a local Urgent Care facility, please use the link below. It will take you to a list of all of our available [facility], including address, phone number and hours of operation. Please do not delay care.  [facility]  If you or a family member do not have a primary care provider, use the link below to schedule a visit and establish care. When you choose a [facility] primary  care physician or advanced practice provider, you gain a long-term partner in health. Find a Primary Care Provider  Learn more about [facility] in-office and virtual care options: [facility] - Get Care Now

## 2024-03-30 NOTE — Progress Notes (Signed)
 Virtual Visit Consent   Cathy Stone, you are scheduled for a virtual visit with a [facility] provider today. Just as with appointments in the office, your consent must be obtained to participate. Your consent will be active for this visit and any virtual visit you may have with one of our providers in the next 365 days. If you have a MyChart account, a copy of this consent can be sent to you electronically.  As this is a virtual visit, video technology does not allow for your provider to perform a traditional examination. This may limit your provider's ability to fully assess your condition. If your provider identifies any concerns that need to be evaluated in person or the need to arrange testing (such as labs, EKG, etc.), we will make arrangements to do so. Although advances in technology are sophisticated, we cannot ensure that it will always work on either your end or our end. If the connection with a video visit is poor, the visit may have to be switched to a telephone visit. With either a video or telephone visit, we are not always able to ensure that we have a secure connection.  By engaging in this virtual visit, you consent to the provision of healthcare and authorize for your insurance to be billed (if applicable) for the services provided during this visit. Depending on your insurance coverage, you may receive a charge related to this service.  I need to obtain your verbal consent now. Are you willing to proceed with your visit today? Cathy Stone has provided verbal consent on 03/30/2024 for a virtual visit (video or telephone). Jon CHRISTELLA Belt, NP  Date: 03/30/2024 9:36 AM   Virtual Visit via Video Note   I, Jon CHRISTELLA Belt, connected with  Cathy Stone  ([id number], 48) on 03/30/24 at  9:30 AM EDT by a video-enabled telemedicine application and verified that I am speaking with the correct person using two identifiers.  Location: Patient: Virtual Visit Location  Patient: Home Provider: Virtual Visit Location Provider: Home Office   I discussed the limitations of evaluation and management by telemedicine and the availability of in person appointments. The patient expressed understanding and agreed to proceed.    History of Present Illness: Cathy Stone is a 48 y.o. who identifies as a female who was assigned female at birth, and is being seen today for cough for 1 day. Has a lot of post nasal drainage. No nasal congestion or fever. Is not SOB, no wheezing. Often gets bronchitis and feels like she has it now. When she gets bronchitis, zpak is usual treatment. Is taking Mucinex , [employer] brand cough/congestion medicine for little relief. Sputum is clear with maybe a little yellow in it.   Has used an inhaler in the past and thinks she would benefit from one now. Denies COPD, asthma, smoking  history. Diabetes is well controlled.    HPI: HPI  Problems:  Patient Active Problem List   Diagnosis Date Noted   Diabetes mellitus without complication (HCC) 02/23/2022   Hyperlipidemia associated with type 2 diabetes mellitus (HCC) 02/23/2022   BMI 39.0-39.9,adult 03/13/2015   Rhinitis, allergic 03/13/2015   GERD (gastroesophageal reflux disease) 03/13/2015   Helicobacter positive gastritis 08/03/2014    Allergies:  Allergies  Allergen Reactions   Darvocet [Propoxyphene N-Acetaminophen] Rash   Medications:  Current Outpatient Medications:    albuterol  (VENTOLIN  HFA) 108 (90 Base) MCG/ACT inhaler, Inhale 2 puffs into the lungs every 6 (six) hours as needed for  wheezing or shortness of breath., Disp: 8 g, Rfl: 0   benzonatate  (TESSALON ) 100 MG capsule, Take 1 capsule (100 mg total) by mouth 2 (two) times daily as needed for cough., Disp: 20 capsule, Rfl: 0   predniSONE  (STERAPRED UNI-PAK 21 TAB) 10 MG (21) TBPK tablet, Take 6 tabs on day 1; take 5 tabs day 2; take 4 tabs day 3; take 3 tabs day 4; take 2 tabs day 5; take 1 tab day 6, Disp: 21 tablet, Rfl:  0   blood glucose meter kit and supplies, Dispense based on patient and insurance preference. Use up to four times daily as directed. (FOR ICD-10 E10.9, E11.9)., Disp: 1 each, Rfl: 0   cetirizine  (ZYRTEC ) 10 MG tablet, Take 1 tablet (10 mg total) by mouth daily., Disp: 90 tablet, Rfl: 0   fluticasone  (FLONASE ) 50 MCG/ACT nasal spray, Place 2 sprays into both nostrils daily., Disp: 16 g, Rfl: 6   metFORMIN  (GLUCOPHAGE ) 500 MG tablet, Take 1 tablet (500 mg total) by mouth 2 (two) times daily with a meal., Disp: 180 tablet, Rfl: 1   omeprazole  (PRILOSEC) 40 MG capsule, Take 1 capsule (40 mg total) by mouth daily., Disp: 90 capsule, Rfl: 1   Semaglutide , 2 MG/DOSE, (OZEMPIC , 2 MG/DOSE,) 8 MG/3ML SOPN, INJECT 2 MG SUBCUTANEOUSLY AS DIRECTED ONCE WEEKLY, Disp: 9 mL, Rfl: 0  Observations/Objective: Patient is well-developed, well-nourished in no acute distress.  Resting comfortably  at home.  Head is normocephalic, atraumatic.  No labored breathing. Does have frequent dry coughing episodes Speech is clear and coherent with logical content.  Patient is alert and oriented at baseline.    Assessment and Plan: 1. Bronchitis (Primary)  Sick for 1 day. Will treat for acute bronchitis. I do not suspect bacterial infection at this time. I suggested she do an evisit with us  if this treatment plan not working/she gets worse (fever, etc)  Follow Up Instructions: I discussed the assessment and treatment plan with the patient. The patient was provided an opportunity to ask questions and all were answered. The patient agreed with the plan and demonstrated an understanding of the instructions.  A copy of instructions were sent to the patient via MyChart unless otherwise noted below.   The patient was advised to call back or seek an in-person evaluation if the symptoms worsen or if the condition fails to improve as anticipated.    Jon CHRISTELLA Belt, NP

## 2024-04-03 ENCOUNTER — Inpatient Hospital Stay (HOSPITAL_COMMUNITY): Admission: RE | Admit: 2024-04-03 | Payer: Self-pay | Source: Ambulatory Visit

## 2024-04-03 DIAGNOSIS — Z1231 Encounter for screening mammogram for malignant neoplasm of breast: Secondary | ICD-10-CM

## 2024-04-04 ENCOUNTER — Encounter: Admitting: Nurse Practitioner

## 2024-04-06 ENCOUNTER — Encounter: Payer: Self-pay | Admitting: Nurse Practitioner

## 2024-04-06 ENCOUNTER — Telehealth (INDEPENDENT_AMBULATORY_CARE_PROVIDER_SITE_OTHER): Admitting: Nurse Practitioner

## 2024-04-06 DIAGNOSIS — J40 Bronchitis, not specified as acute or chronic: Secondary | ICD-10-CM

## 2024-04-06 MED ORDER — HYDROCODONE BIT-HOMATROP MBR 5-1.5 MG/5ML PO SOLN: 5.0000 mL | Freq: Four times a day (QID) | ORAL | 0 refills | Status: DC | PRN

## 2024-04-06 MED ORDER — AZITHROMYCIN 250 MG PO TABS: ORAL_TABLET | ORAL | 0 refills | Status: DC

## 2024-04-06 NOTE — Patient Instructions (Signed)
1. Take meds as prescribed 2. Use a cool mist humidifier especially during the winter months and when heat has been humid. 3. Use saline nose sprays frequently 4. Saline irrigations of the nose can be very helpful if done frequently.  * 4X daily for 1 week*  * Use of a nettie pot can be helpful with this. Follow directions with this* 5. Drink plenty of fluids 6. Keep thermostat turn down low 7.For any cough or congestion- hycodan with sedation precautions 8. For fever or aces or pains- take tylenol or ibuprofen appropriate for age and weight.  * for fevers greater than 101 orally you may alternate ibuprofen and tylenol every  3 hours.    

## 2024-04-06 NOTE — Progress Notes (Signed)
 Virtual Visit Consent   Cathy Stone, you are scheduled for a virtual visit with Mary-Margaret Gladis, FNP, a [facility] provider, today.     Just as with appointments in the office, your consent must be obtained to participate.  Your consent will be active for this visit and any virtual visit you may have with one of our providers in the next 365 days.     If you have a MyChart account, a copy of this consent can be sent to you electronically.  All virtual visits are billed to your insurance company just like a traditional visit in the office.    As this is a virtual visit, video technology does not allow for your provider to perform a traditional examination.  This may limit your provider's ability to fully assess your condition.  If your provider identifies any concerns that need to be evaluated in person or the need to arrange testing (such as labs, EKG, etc.), we will make arrangements to do so.     Although advances in technology are sophisticated, we cannot ensure that it will always work on either your end or our end.  If the connection with a video visit is poor, the visit may have to be switched to a telephone visit.  With either a video or telephone visit, we are not always able to ensure that we have a secure connection.     I need to obtain your verbal consent now.   Are you willing to proceed with your visit today? YES   Cathy Stone has provided verbal consent on 04/06/2024 for a virtual visit (video or telephone).   Mary-Margaret Gladis, FNP   Date: 04/06/2024 10:46 AM   Virtual Visit via Video Note   I, Mary-Margaret Gladis, connected with Cathy Stone ([id number], 1975-12-10) on 04/06/24 at 12:05 PM EDT by a video-enabled telemedicine application and verified that I am speaking with the correct person using two identifiers.  Location: Patient: Virtual Visit Location Patient: Home Provider: Virtual Visit Location Provider: Mobile   I discussed the  limitations of evaluation and management by telemedicine and the availability of in person appointments. The patient expressed understanding and agreed to proceed.    History of Present Illness: Cathy Stone is a 48 y.o. who identifies as a female who was assigned female at birth, and is being seen today for uri.  HPI: Patient did video visit on 03/30/24. Was dx with viral bronchitis and was given a steroid pack. She says she is no better.  Cough This is a Stone problem. The current episode started in the past 7 days. The problem has been waxing and waning. The problem occurs every few minutes. The cough is Productive of sputum. Associated symptoms include chills, rhinorrhea and shortness of breath. Pertinent negatives include no fever or sore throat. Nothing aggravates the symptoms. She has tried OTC cough suppressant and oral steroids for the symptoms. The treatment provided mild relief.    Review of Systems  Constitutional:  Positive for chills. Negative for fever.  HENT:  Positive for rhinorrhea. Negative for sore throat.   Respiratory:  Positive for cough and shortness of breath.     Problems:  Patient Active Problem List   Diagnosis Date Noted   Diabetes mellitus without complication (HCC) 02/23/2022   Hyperlipidemia associated with type 2 diabetes mellitus (HCC) 02/23/2022   BMI 39.0-39.9,adult 03/13/2015   Rhinitis, allergic 03/13/2015   GERD (gastroesophageal reflux disease) 03/13/2015   Helicobacter positive  gastritis 08/03/2014    Allergies:  Allergies  Allergen Reactions   Darvocet [Propoxyphene N-Acetaminophen] Rash   Medications:  Current Outpatient Medications:    albuterol  (VENTOLIN  HFA) 108 (90 Base) MCG/ACT inhaler, Inhale 2 puffs into the lungs every 6 (six) hours as needed for wheezing or shortness of breath., Disp: 8 g, Rfl: 0   benzonatate  (TESSALON ) 100 MG capsule, Take 1 capsule (100 mg total) by mouth 2 (two) times daily as needed for cough., Disp: 20  capsule, Rfl: 0   blood glucose meter kit and supplies, Dispense based on patient and insurance preference. Use up to four times daily as directed. (FOR ICD-10 E10.9, E11.9)., Disp: 1 each, Rfl: 0   cetirizine  (ZYRTEC ) 10 MG tablet, Take 1 tablet (10 mg total) by mouth daily., Disp: 90 tablet, Rfl: 0   fluticasone  (FLONASE ) 50 MCG/ACT nasal spray, Place 2 sprays into both nostrils daily., Disp: 16 g, Rfl: 6   metFORMIN  (GLUCOPHAGE ) 500 MG tablet, Take 1 tablet (500 mg total) by mouth 2 (two) times daily with a meal., Disp: 180 tablet, Rfl: 1   omeprazole  (PRILOSEC) 40 MG capsule, Take 1 capsule (40 mg total) by mouth daily., Disp: 90 capsule, Rfl: 1   predniSONE  (STERAPRED UNI-PAK 21 TAB) 10 MG (21) TBPK tablet, Take 6 tabs on day 1; take 5 tabs day 2; take 4 tabs day 3; take 3 tabs day 4; take 2 tabs day 5; take 1 tab day 6, Disp: 21 tablet, Rfl: 0   Semaglutide , 2 MG/DOSE, (OZEMPIC , 2 MG/DOSE,) 8 MG/3ML SOPN, INJECT 2 MG SUBCUTANEOUSLY AS DIRECTED ONCE WEEKLY, Disp: 9 mL, Rfl: 0  Observations/Objective: Patient is well-developed, well-nourished in no acute distress.  Resting comfortably at home.  Head is normocephalic, atraumatic.  No labored breathing.  Speech is clear and coherent with logical content.  Patient is alert and oriented at baseline.  Raspy voice Deep dry cough noted frequently throughout exam  Assessment and Plan:  Cathy Stone in today with chief complaint of No chief complaint on file.   1. Bronchitis (Primary) 1. Take meds as prescribed 2. Use a cool mist humidifier especially during the winter months and when heat has been humid. 3. Use saline nose sprays frequently 4. Saline irrigations of the nose can be very helpful if done frequently.  * 4X daily for 1 week*  * Use of a nettie pot can be helpful with this. Follow directions with this* 5. Drink plenty of fluids 6. Keep thermostat turn down low 7.For any cough or congestion- hycodan with sedation  precautions 8. For fever or aces or pains- take tylenol or ibuprofen appropriate for age and weight.  * for fevers greater than 101 orally you may alternate ibuprofen and tylenol every  3 hours.    Meds ordered this encounter  Medications   azithromycin  (ZITHROMAX  Z-PAK) 250 MG tablet    Sig: As directed    Dispense:  6 tablet    Refill:  0    Supervising Provider:   MARYANNE CHEW A [[id number]]   HYDROcodone  bit-homatropine (HYCODAN) 5-1.5 MG/5ML syrup    Sig: Take 5 mLs by mouth every 6 (six) hours as needed for cough.    Dispense:  120 mL    Refill:  0    Supervising Provider:   MARYANNE CHEW A [[id number]]       Follow Up Instructions: I discussed the assessment and treatment plan with the patient. The patient was provided an opportunity to ask questions and all  were answered. The patient agreed with the plan and demonstrated an understanding of the instructions.  A copy of instructions were sent to the patient via MyChart.  The patient was advised to call back or seek an in-person evaluation if the symptoms worsen or if the condition fails to improve as anticipated.  Time:  I spent 15 minutes with the patient via telehealth technology discussing the above problems/concerns.    Mary-Margaret Gladis, FNP

## 2024-04-07 ENCOUNTER — Ambulatory Visit: Admitting: Nurse Practitioner

## 2024-04-12 ENCOUNTER — Ambulatory Visit: Payer: Self-pay | Admitting: *Deleted

## 2024-04-12 NOTE — Telephone Encounter (Signed)
 I called and spoke with patient and got her scheduled to see PCP tomorrow morning to be re-evaluated.

## 2024-04-12 NOTE — Telephone Encounter (Signed)
 FYI Only or Action Required?: Action required by provider: update on patient condition and please advise if additional medication needed , patient was told to call back if not feeling better after prednisone  and antibiotics.  Patient was last seen in primary care on 04/06/2024 by Gladis Mustard, FNP.  Called Nurse Triage reporting Cough.  Symptoms began several days ago.  Interventions attempted: Prescription medications: azithromycin , prednisone   and Rest, hydration, or home remedies.  Symptoms are: unchanged.  Triage Disposition: See PCP When Office is Open (Within 3 Days)  Patient/caregiver understands and will follow disposition?: No, wishes to speak with PCP    Patient reports she was told to call back if not feeling better. Please advise if OV needed. No difficulty breathing reported.            Copied from CRM [id number]. Topic: Clinical - Red Word Triage >> Apr 12, 2024  8:09 AM Larissa RAMAN wrote: [facility] that prompted transfer to Nurse Triage: cough with mucous, chest congestion- worsening Reason for Disposition  Cough has been present for > 3 weeks  Answer Assessment - Initial Assessment Questions Patient requesting to let PCP know sx of chest congestion not better. Losing voice. Completed antibiotics and prednisone  Sunday or Monday. Denies difficulty breathing and no SOB. Please advise if another appt needed or additional medications needed. Recommended if SOB noted call back or go to UC.      1. ONSET: When did the cough begin?      Few weeks ago already seen by PCP 2. SEVERITY: How bad is the cough today?      No better still has nasal congestion  3. SPUTUM: Describe the color of your sputum (e.g., none, dry cough; clear, white, yellow, green)     continues 4. HEMOPTYSIS: Are you coughing up any blood? If Yes, ask: How much? (e.g., flecks, streaks, tablespoons, etc.)     na 5. DIFFICULTY BREATHING: Are you having difficulty breathing? If  Yes, ask: How bad is it? (e.g., mild, moderate, severe)      Congestion no difficulty  6. FEVER: Do you have a fever? If Yes, ask: What is your temperature, how was it measured, and when did it start?     Na  7. CARDIAC HISTORY: Do you have any history of heart disease? (e.g., heart attack, congestive heart failure)      na 8. LUNG HISTORY: Do you have any history of lung disease?  (e.g., pulmonary embolus, asthma, emphysema)     na 9. PE RISK FACTORS: Do you have a history of blood clots? (or: recent major surgery, recent prolonged travel, bedridden)     na 10. OTHER SYMPTOMS: Do you have any other symptoms? (e.g., runny nose, wheezing, chest pain)       Nasal congestion , cough. Losing voice. Sx not better 11. PREGNANCY: Is there any chance you are pregnant? When was your last menstrual period?       na 12. TRAVEL: Have you traveled out of the country in the last month? (e.g., travel history, exposures)       na  Protocols used: Cough - Acute Productive-A-AH

## 2024-04-13 ENCOUNTER — Encounter: Payer: Self-pay | Admitting: Nurse Practitioner

## 2024-04-13 ENCOUNTER — Ambulatory Visit: Admitting: Nurse Practitioner

## 2024-04-13 VITALS — BP 113/74 | HR 69 | Temp 98.0°F | Ht 67.0 in | Wt 265.0 lb

## 2024-04-13 DIAGNOSIS — K219 Gastro-esophageal reflux disease without esophagitis: Secondary | ICD-10-CM | POA: Diagnosis not present

## 2024-04-13 DIAGNOSIS — Z6839 Body mass index (BMI) 39.0-39.9, adult: Secondary | ICD-10-CM | POA: Diagnosis not present

## 2024-04-13 DIAGNOSIS — E785 Hyperlipidemia, unspecified: Secondary | ICD-10-CM

## 2024-04-13 DIAGNOSIS — E1169 Type 2 diabetes mellitus with other specified complication: Secondary | ICD-10-CM | POA: Diagnosis not present

## 2024-04-13 DIAGNOSIS — Z7984 Long term (current) use of oral hypoglycemic drugs: Secondary | ICD-10-CM

## 2024-04-13 DIAGNOSIS — E119 Type 2 diabetes mellitus without complications: Secondary | ICD-10-CM

## 2024-04-13 DIAGNOSIS — R051 Acute cough: Secondary | ICD-10-CM

## 2024-04-13 LAB — BAYER DCA HB A1C WAIVED: HB A1C (BAYER DCA - WAIVED): 6.1 % — ABNORMAL HIGH (ref 4.8–5.6)

## 2024-04-13 LAB — LIPID PANEL

## 2024-04-13 MED ORDER — OMEPRAZOLE 40 MG PO CPDR: 40.0000 mg | DELAYED_RELEASE_CAPSULE | Freq: Every day | ORAL | 1 refills | Status: AC

## 2024-04-13 MED ORDER — METFORMIN HCL 500 MG PO TABS: 500.0000 mg | ORAL_TABLET | Freq: Two times a day (BID) | ORAL | 1 refills | Status: AC

## 2024-04-13 MED ORDER — PREDNISONE 20 MG PO TABS: 40.0000 mg | ORAL_TABLET | Freq: Every day | ORAL | 0 refills | Status: AC

## 2024-04-13 MED ORDER — AZITHROMYCIN 250 MG PO TABS
ORAL_TABLET | ORAL | 0 refills | Status: AC
Start: 2024-04-13 — End: ?

## 2024-04-13 NOTE — Patient Instructions (Signed)

## 2024-04-13 NOTE — Progress Notes (Signed)
 Subjective:    Patient ID: Cathy Stone, female    DOB: 28-Jan-1976, 48 y.o.   MRN: [id number]   Chief Complaint: medical management of chronic issues     HPI:  Cathy Stone is a 48 y.o. who identifies as a female who was assigned female at birth.   Social history: Lives with: husband Work history: works for [employer] in today for follow up of the following chronic medical issues:  1. Diabetes mellitus without complication (HCC) Does not check blood sugars at home. Tries to watch sugar and carb intake. Has been on ozempic  and just took her last injection. Insurance does not cover it well since the beginning of the year. She is going to start back on metformin  next week.  Lab Results  Component Value Date   HGBA1C 5.0 07/19/2023     2. Hyperlipidemia associated with type 2 diabetes mellitus (HCC) Tries to watch her diet; no dedicated exercise. This SmartLink has not been configured with any valid records.   Lab Results  Component Value Date   CHOL 164 07/19/2023   HDL 46 07/19/2023   LDLCALC 103 (H) 07/19/2023   TRIG 78 07/19/2023   CHOLHDL 3.6 07/19/2023   3. GERD Is on nexium  daily and is doing well.  4. BMI 37.0-37.9 Weight is unchanged Wt Readings from Last 3 Encounters:  04/13/24 265 lb (120.2 kg)  03/06/24 264 lb (119.7 kg)  07/19/23 247 lb (112 kg)   BMI Readings from Last 3 Encounters:  04/13/24 41.50 kg/m  03/06/24 41.35 kg/m  07/19/23 38.69 kg/m      New complaints: None today  Allergies  Allergen Reactions   Darvocet [Propoxyphene N-Acetaminophen] Rash   Outpatient Encounter Medications as of 04/13/2024  Medication Sig   albuterol  (VENTOLIN  HFA) 108 (90 Base) MCG/ACT inhaler Inhale 2 puffs into the lungs every 6 (six) hours as needed for wheezing or shortness of breath.   benzonatate  (TESSALON ) 100 MG capsule Take 1 capsule (100 mg total) by mouth 2 (two) times daily as needed for cough.   blood glucose meter kit and  supplies Dispense based on patient and insurance preference. Use up to four times daily as directed. (FOR ICD-10 E10.9, E11.9).   fluticasone  (FLONASE ) 50 MCG/ACT nasal spray Place 2 sprays into both nostrils daily.   metFORMIN  (GLUCOPHAGE ) 500 MG tablet Take 1 tablet (500 mg total) by mouth 2 (two) times daily with a meal.   omeprazole  (PRILOSEC) 40 MG capsule Take 1 capsule (40 mg total) by mouth daily.   [DISCONTINUED] HYDROcodone  bit-homatropine (HYCODAN) 5-1.5 MG/5ML syrup Take 5 mLs by mouth every 6 (six) hours as needed for cough.   cetirizine  (ZYRTEC ) 10 MG tablet Take 1 tablet (10 mg total) by mouth daily. (Patient not taking: Reported on 04/13/2024)   [DISCONTINUED] azithromycin  (ZITHROMAX  Z-PAK) 250 MG tablet As directed   [DISCONTINUED] predniSONE  (STERAPRED UNI-PAK 21 TAB) 10 MG (21) TBPK tablet Take 6 tabs on day 1; take 5 tabs day 2; take 4 tabs day 3; take 3 tabs day 4; take 2 tabs day 5; take 1 tab day 6   [DISCONTINUED] Semaglutide , 2 MG/DOSE, (OZEMPIC , 2 MG/DOSE,) 8 MG/3ML SOPN INJECT 2 MG SUBCUTANEOUSLY AS DIRECTED ONCE WEEKLY   No facility-administered encounter medications on file as of 04/13/2024.    Past Surgical History:  Procedure Laterality Date   CESAREAN SECTION     DENTAL SURGERY      Family History  Problem Relation Age of Onset  Asthma Mother    COPD Mother    Cancer Father    Diabetes Other       Controlled substance contract: n/a    Review of Systems  Constitutional:  Negative for appetite change and fatigue.  HENT: Negative.    Eyes:  Negative for pain and visual disturbance.  Respiratory:  Negative for chest tightness and shortness of breath.   Cardiovascular:  Negative for chest pain, palpitations and leg swelling.  Gastrointestinal:  Positive for constipation. Negative for abdominal pain.  Endocrine: Negative for polydipsia and polyuria.  Genitourinary:  Negative for difficulty urinating.  Musculoskeletal:  Negative for arthralgias.   Skin: Negative.   Neurological:  Negative for dizziness and headaches.  Hematological:  Negative for adenopathy. Does not bruise/bleed easily.  Psychiatric/Behavioral: Negative.         Objective:   Physical Exam Vitals and nursing note reviewed.  Constitutional:      General: She is not in acute distress.    Appearance: Normal appearance. She is well-groomed. She is not ill-appearing.  HENT:     Head: Normocephalic and atraumatic.     Right Ear: Tympanic membrane normal.     Left Ear: Tympanic membrane normal.     Nose: Nose normal.     Mouth/Throat:     Mouth: Mucous membranes are moist.     Pharynx: Oropharynx is clear.  Eyes:     Conjunctiva/sclera: Conjunctivae normal.     Pupils: Pupils are equal, round, and reactive to light.  Neck:     Thyroid : No thyroid  mass, thyromegaly or thyroid  tenderness.  Cardiovascular:     Rate and Rhythm: Normal rate and regular rhythm.     Pulses: Normal pulses.     Heart sounds: Normal heart sounds.  Pulmonary:     Effort: Pulmonary effort is normal.     Breath sounds: Normal breath sounds.  Abdominal:     General: Abdomen is flat. Bowel sounds are normal. There is no distension.     Palpations: Abdomen is soft. There is no mass.     Tenderness: There is no abdominal tenderness. There is no guarding or rebound.  Musculoskeletal:        General: Normal range of motion.     Cervical back: Normal range of motion. No rigidity or tenderness.  Lymphadenopathy:     Cervical: No cervical adenopathy.  Skin:    General: Skin is warm and dry.     Capillary Refill: Capillary refill takes less than 2 seconds.  Neurological:     General: No focal deficit present.     Mental Status: She is alert and oriented to person, place, and time.  Psychiatric:        Mood and Affect: Mood normal.        Behavior: Behavior normal.        Thought Content: Thought content normal.    BP 113/74   Pulse 69   Temp 98 F (36.7 C) (Temporal)   Ht 5' 7  (1.702 m)   Wt 265 lb (120.2 kg)   SpO2 97%   BMI 41.50 kg/m   A1c today: 6.1%        Assessment & Plan:   Cathy Stone comes in today with chief complaint of medical management of chronic issues    Diagnosis and orders addressed:  1. Diabetes mellitus without complication (HCC) P\atient going to call insurance about ozempic  metformin  500mg  bid  Continue to watch carb and sugar intake Daily  exercise - CBC with Differential/Platelet - CMP14+EGFR - Bayer DCA Hb A1c Waived  2. Hyperlipidemia associated with type 2 diabetes mellitus (HCC) Low fat diet - Lipid panel  3. Gastroesophageal reflux disease, unspecified whether esophagitis present Avoid spicy foods Avoid eating 2 hours before bedtime - esomeprazole  (NEXIUM ) 20 MG capsule; Take 1 capsule (20 mg total) by mouth daily at 12 noon.  Dispense: 90 capsule; Refill: 1  4. BMI 38.0-38.9 Discussed diet and exercise for person with BMI >25 Will recheck weight in 3-6 months  Labs pending Health Maintenance reviewed Diet and exercise encouraged  Follow up plan: 6 months    Mary-Margaret Gladis, FNP

## 2024-04-14 ENCOUNTER — Ambulatory Visit: Payer: Self-pay | Admitting: Nurse Practitioner

## 2024-04-14 LAB — CBC WITH DIFFERENTIAL/PLATELET
Basophils Absolute: 0.1 x10E3/uL (ref 0.0–0.2)
Basos: 1 %
EOS (ABSOLUTE): 0.2 x10E3/uL (ref 0.0–0.4)
Eos: 2 %
Hematocrit: 37.9 % (ref 34.0–46.6)
Hemoglobin: 11.5 g/dL (ref 11.1–15.9)
Immature Grans (Abs): 0 x10E3/uL (ref 0.0–0.1)
Immature Granulocytes: 0 %
Lymphocytes Absolute: 2.9 x10E3/uL (ref 0.7–3.1)
Lymphs: 26 %
MCH: 25 pg — ABNORMAL LOW (ref 26.6–33.0)
MCHC: 30.3 g/dL — ABNORMAL LOW (ref 31.5–35.7)
MCV: 82 fL (ref 79–97)
Monocytes Absolute: 1 x10E3/uL — ABNORMAL HIGH (ref 0.1–0.9)
Monocytes: 9 %
Neutrophils Absolute: 6.8 x10E3/uL (ref 1.4–7.0)
Neutrophils: 62 %
Platelets: 310 x10E3/uL (ref 150–450)
RBC: 4.6 x10E6/uL (ref 3.77–5.28)
RDW: 13.2 % (ref 11.7–15.4)
WBC: 11 x10E3/uL — ABNORMAL HIGH (ref 3.4–10.8)

## 2024-04-14 LAB — CMP14+EGFR
ALT: 15 IU/L (ref 0–32)
AST: 11 IU/L (ref 0–40)
Albumin: 3.7 g/dL — ABNORMAL LOW (ref 3.9–4.9)
Alkaline Phosphatase: 95 IU/L (ref 41–116)
BUN/Creatinine Ratio: 13 (ref 9–23)
BUN: 13 mg/dL (ref 6–24)
Bilirubin Total: 0.4 mg/dL (ref 0.0–1.2)
CO2: 23 mmol/L (ref 20–29)
Calcium: 9.3 mg/dL (ref 8.7–10.2)
Chloride: 102 mmol/L (ref 96–106)
Creatinine, Ser: 0.97 mg/dL (ref 0.57–1.00)
Globulin, Total: 3.1 g/dL (ref 1.5–4.5)
Glucose: 119 mg/dL — ABNORMAL HIGH (ref 70–99)
Potassium: 4.8 mmol/L (ref 3.5–5.2)
Sodium: 138 mmol/L (ref 134–144)
Total Protein: 6.8 g/dL (ref 6.0–8.5)
eGFR: 73 mL/min/1.73 (ref >59)

## 2024-04-14 LAB — LIPID PANEL
Chol/HDL Ratio: 3.4 ratio (ref 0.0–4.4)
Cholesterol, Total: 168 mg/dL (ref 100–199)
HDL: 50 mg/dL (ref >39)
LDL Chol Calc (NIH): 105 mg/dL — ABNORMAL HIGH (ref 0–99)
Triglycerides: 65 mg/dL (ref 0–149)
VLDL Cholesterol Cal: 13 mg/dL (ref 5–40)

## 2024-04-14 LAB — VITAMIN B12: Vitamin B-12: 406 pg/mL (ref 232–1245)

## 2024-04-21 ENCOUNTER — Ambulatory Visit: Payer: Self-pay | Admitting: Nurse Practitioner

## 2024-04-27 ENCOUNTER — Encounter: Payer: Self-pay | Admitting: Nurse Practitioner

## 2024-10-10 ENCOUNTER — Ambulatory Visit: Admitting: Nurse Practitioner
